# Patient Record
Sex: Male | Born: 1980 | Race: Black or African American | Hispanic: No | Marital: Single | Smoking: Current every day smoker
Health system: Southern US, Community
[De-identification: ages and names within clinical notes are randomized; demographics above are authoritative.]

## PROBLEM LIST (undated history)

## (undated) HISTORY — PX: APPENDECTOMY: SHX54

---

## 2013-05-12 DIAGNOSIS — E785 Hyperlipidemia, unspecified: Secondary | ICD-10-CM | POA: Insufficient documentation

## 2013-05-12 HISTORY — DX: Hyperlipidemia, unspecified: E78.5

## 2013-07-26 DIAGNOSIS — J3089 Other allergic rhinitis: Secondary | ICD-10-CM

## 2013-07-26 HISTORY — DX: Other allergic rhinitis: J30.89

## 2016-07-13 DIAGNOSIS — IMO0001 Reserved for inherently not codable concepts without codable children: Secondary | ICD-10-CM | POA: Insufficient documentation

## 2016-07-13 DIAGNOSIS — F331 Major depressive disorder, recurrent, moderate: Secondary | ICD-10-CM | POA: Insufficient documentation

## 2016-07-13 DIAGNOSIS — B360 Pityriasis versicolor: Secondary | ICD-10-CM

## 2016-07-13 HISTORY — DX: Major depressive disorder, recurrent, moderate: F33.1

## 2016-07-13 HISTORY — DX: Pityriasis versicolor: B36.0

## 2016-07-13 HISTORY — DX: Reserved for inherently not codable concepts without codable children: IMO0001

## 2016-08-10 DIAGNOSIS — H6993 Unspecified Eustachian tube disorder, bilateral: Secondary | ICD-10-CM

## 2016-08-10 DIAGNOSIS — H6983 Other specified disorders of Eustachian tube, bilateral: Secondary | ICD-10-CM | POA: Insufficient documentation

## 2016-08-10 HISTORY — DX: Unspecified eustachian tube disorder, bilateral: H69.93

## 2016-08-10 HISTORY — DX: Other specified disorders of eustachian tube, bilateral: H69.83

## 2017-02-01 ENCOUNTER — Ambulatory Visit: Payer: Self-pay

## 2017-02-01 ENCOUNTER — Other Ambulatory Visit: Payer: Self-pay | Admitting: Occupational Medicine

## 2017-02-01 DIAGNOSIS — Z0000 Encounter for general adult medical examination without abnormal findings: Secondary | ICD-10-CM

## 2017-12-06 DIAGNOSIS — J342 Deviated nasal septum: Secondary | ICD-10-CM | POA: Insufficient documentation

## 2017-12-06 DIAGNOSIS — J352 Hypertrophy of adenoids: Secondary | ICD-10-CM | POA: Insufficient documentation

## 2017-12-06 DIAGNOSIS — J301 Allergic rhinitis due to pollen: Secondary | ICD-10-CM | POA: Insufficient documentation

## 2017-12-06 DIAGNOSIS — J343 Hypertrophy of nasal turbinates: Secondary | ICD-10-CM | POA: Insufficient documentation

## 2017-12-06 HISTORY — DX: Hypertrophy of adenoids: J35.2

## 2017-12-06 HISTORY — DX: Deviated nasal septum: J34.2

## 2017-12-06 HISTORY — DX: Hypertrophy of nasal turbinates: J34.3

## 2017-12-06 HISTORY — DX: Allergic rhinitis due to pollen: J30.1

## 2018-01-02 DIAGNOSIS — J328 Other chronic sinusitis: Secondary | ICD-10-CM

## 2018-01-02 DIAGNOSIS — J3489 Other specified disorders of nose and nasal sinuses: Secondary | ICD-10-CM

## 2018-01-02 HISTORY — DX: Other specified disorders of nose and nasal sinuses: J34.89

## 2018-01-02 HISTORY — DX: Other chronic sinusitis: J32.8

## 2018-02-01 DIAGNOSIS — F419 Anxiety disorder, unspecified: Secondary | ICD-10-CM | POA: Insufficient documentation

## 2018-02-01 HISTORY — DX: Anxiety disorder, unspecified: F41.9

## 2018-04-19 DIAGNOSIS — R030 Elevated blood-pressure reading, without diagnosis of hypertension: Secondary | ICD-10-CM | POA: Insufficient documentation

## 2018-04-19 DIAGNOSIS — R739 Hyperglycemia, unspecified: Secondary | ICD-10-CM | POA: Insufficient documentation

## 2018-04-19 HISTORY — DX: Elevated blood-pressure reading, without diagnosis of hypertension: R03.0

## 2018-04-19 HISTORY — DX: Hyperglycemia, unspecified: R73.9

## 2018-12-06 DIAGNOSIS — T4275XA Adverse effect of unspecified antiepileptic and sedative-hypnotic drugs, initial encounter: Secondary | ICD-10-CM | POA: Insufficient documentation

## 2018-12-06 DIAGNOSIS — F411 Generalized anxiety disorder: Secondary | ICD-10-CM

## 2018-12-06 DIAGNOSIS — R42 Dizziness and giddiness: Secondary | ICD-10-CM

## 2018-12-06 HISTORY — DX: Adverse effect of unspecified antiepileptic and sedative-hypnotic drugs, initial encounter: T42.75XA

## 2018-12-06 HISTORY — DX: Generalized anxiety disorder: F41.1

## 2018-12-06 HISTORY — DX: Dizziness and giddiness: R42

## 2019-01-17 DIAGNOSIS — S90424A Blister (nonthermal), right lesser toe(s), initial encounter: Secondary | ICD-10-CM | POA: Insufficient documentation

## 2019-01-17 DIAGNOSIS — L089 Local infection of the skin and subcutaneous tissue, unspecified: Secondary | ICD-10-CM

## 2019-01-17 HISTORY — DX: Local infection of the skin and subcutaneous tissue, unspecified: L08.9

## 2019-02-13 DIAGNOSIS — D352 Benign neoplasm of pituitary gland: Secondary | ICD-10-CM

## 2019-02-13 DIAGNOSIS — R7303 Prediabetes: Secondary | ICD-10-CM

## 2019-02-13 HISTORY — DX: Benign neoplasm of pituitary gland: D35.2

## 2019-02-13 HISTORY — DX: Prediabetes: R73.03

## 2020-04-30 ENCOUNTER — Ambulatory Visit (INDEPENDENT_AMBULATORY_CARE_PROVIDER_SITE_OTHER): Payer: Self-pay | Admitting: Podiatry

## 2020-04-30 ENCOUNTER — Emergency Department (INDEPENDENT_AMBULATORY_CARE_PROVIDER_SITE_OTHER)
Admission: EM | Admit: 2020-04-30 | Discharge: 2020-04-30 | Disposition: A | Discharge status: Home / Self Care | Payer: Self-pay | Source: Home / Self Care

## 2020-04-30 ENCOUNTER — Other Ambulatory Visit: Payer: Self-pay

## 2020-04-30 ENCOUNTER — Encounter: Payer: Self-pay | Admitting: Podiatry

## 2020-04-30 ENCOUNTER — Encounter: Payer: Self-pay | Admitting: Emergency Medicine

## 2020-04-30 DIAGNOSIS — R002 Palpitations: Secondary | ICD-10-CM

## 2020-04-30 DIAGNOSIS — R509 Fever, unspecified: Secondary | ICD-10-CM

## 2020-04-30 DIAGNOSIS — H938X3 Other specified disorders of ear, bilateral: Secondary | ICD-10-CM

## 2020-04-30 DIAGNOSIS — H9203 Otalgia, bilateral: Secondary | ICD-10-CM | POA: Insufficient documentation

## 2020-04-30 DIAGNOSIS — L298 Other pruritus: Secondary | ICD-10-CM

## 2020-04-30 DIAGNOSIS — L2989 Other pruritus: Secondary | ICD-10-CM

## 2020-04-30 DIAGNOSIS — B369 Superficial mycosis, unspecified: Secondary | ICD-10-CM

## 2020-04-30 DIAGNOSIS — F1010 Alcohol abuse, uncomplicated: Secondary | ICD-10-CM

## 2020-04-30 DIAGNOSIS — R21 Rash and other nonspecific skin eruption: Secondary | ICD-10-CM

## 2020-04-30 HISTORY — DX: Otalgia, bilateral: H92.03

## 2020-04-30 HISTORY — DX: Other specified disorders of ear, bilateral: H93.8X3

## 2020-04-30 LAB — POCT URINALYSIS DIP (MANUAL ENTRY)
Bilirubin, UA: NEGATIVE
Glucose, UA: NEGATIVE mg/dL
Ketones, POC UA: NEGATIVE mg/dL
Leukocytes, UA: NEGATIVE
Nitrite, UA: NEGATIVE
Protein Ur, POC: NEGATIVE mg/dL
Spec Grav, UA: 1.015 (ref 1.010–1.025)
Urobilinogen, UA: 0.2 E.U./dL
pH, UA: 5 (ref 5.0–8.0)

## 2020-04-30 LAB — COMPLETE METABOLIC PANEL WITH GFR
AG Ratio: 1.8 (calc) (ref 1.0–2.5)
ALT: 37 U/L (ref 9–46)
AST: 25 U/L (ref 10–40)
Albumin: 4.6 g/dL (ref 3.6–5.1)
Alkaline phosphatase (APISO): 58 U/L (ref 36–130)
BUN: 18 mg/dL (ref 7–25)
CO2: 24 mmol/L (ref 20–32)
Calcium: 9.7 mg/dL (ref 8.6–10.3)
Chloride: 102 mmol/L (ref 98–110)
Creat: 1.07 mg/dL (ref 0.60–1.35)
GFR, Est African American: 101 mL/min/{1.73_m2} (ref >=60)
GFR, Est Non African American: 87 mL/min/{1.73_m2} (ref >=60)
Globulin: 2.6 g/dL (calc) (ref 1.9–3.7)
Glucose, Bld: 92 mg/dL (ref 65–99)
Potassium: 4 mmol/L (ref 3.5–5.3)
Sodium: 137 mmol/L (ref 135–146)
Total Bilirubin: 0.3 mg/dL (ref 0.2–1.2)
Total Protein: 7.2 g/dL (ref 6.1–8.1)

## 2020-04-30 LAB — POCT CBC W AUTO DIFF (K'VILLE URGENT CARE)

## 2020-04-30 LAB — POCT FASTING CBG KUC MANUAL ENTRY: POCT Glucose (KUC): 99 mg/dL (ref 70–99)

## 2020-04-30 NOTE — ED Triage Notes (Signed)
Pt woke up after a nap last night with palpitations - resolved after 2 hours  ( on & off) made pt anxious  Pt had been out in the yard yesterday - drank some alcohol w/ limited water intake Has increased stress at home & has been drinking more alcohol & started smoking again COVID vaccine & booster

## 2020-04-30 NOTE — ED Provider Notes (Signed)
[name]    CSN: [id number] Arrival date & time: 04/30/20  0920      History   Chief Complaint Chief Complaint  Patient presents with  . Palpitations    HPI Kyle Barnett is a 39 y.o. male.   HPI Kyle Barnett is a 39 y.o. male presenting to UC with c/o palpitations that started yesterday after a nap that resolved within 2 hours. Palpitations have resolved but he feels more anxious today.  He was out in the yard yesterday drinking alcohol and not as much was as usual.  He also reports increased stress at home, causing him to start smoking again and drinking more alcohol over the last 2 months, about 6-12 white claws a night.  Denies chest pain or SOB. No n/v/d.  Hx of palpitations earlier in the year. He was seen at [facility] in February 2021, was referred to a cardiologist but states he never received a call to schedule an appointment.   Pt noted to have a low-grade fever in triage, 100.2*F. pt does report mild body aches. Denies cough, congestion, sore throat, n/v/d. No known sick contacts.   Pt also reports dry itchy rash on his Right foot for over a year and a dark spot on his anterior lower leg for about 6 months. He has an appointment with podiatry later today.  History reviewed. No pertinent past medical history.  Patient Active Problem List   Diagnosis Date Noted  . Fullness in ear, bilateral 04/30/2020  . Otalgia of both ears 04/30/2020  . Prediabetes 02/13/2019  . 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 Infected blister of toe of right foot 01/17/2019  . Anticonvulsant-induced dizziness 12/06/2018  . Anxiety, generalized 12/06/2018  . Hyperglycemia 04/19/2018  . Prehypertension 04/19/2018  . Anxiety 02/01/2018  . 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 Other chronic sinusitis 01/02/2018  . Adenoid hypertrophy 12/06/2017  . Deviated nasal septum 12/06/2017  . Hypertrophy of inferior nasal turbinate 12/06/2017  . Seasonal allergic rhinitis due to pollen  12/06/2017  . Eustachian tube dysfunction, bilateral 08/10/2016  . 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 Tinea versicolor 07/13/2016  . 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 Perennial allergic rhinitis 07/26/2013  . Hyperlipidemia 05/12/2013    History reviewed. No pertinent surgical history.     Home Medications    Prior to Admission medications   Medication Sig Start Date End Date Taking? Authorizing Provider  escitalopram (LEXAPRO) 10 MG tablet Take 10 mg by mouth daily.   Yes [provider]  lamoTRIgine (LAMICTAL) 25 MG tablet Take 50 mg by mouth at bedtime. 02/08/20  Yes [provider]  amoxicillin-clavulanate (AUGMENTIN) 875-125 MG tablet amoxicillin 875 mg-potassium clavulanate 125 mg tablet    [provider]  azelastine (ASTELIN) 0.1 % nasal spray azelastine 137 mcg (0.1 %) nasal spray aerosol 08/13/18   [provider]  azithromycin (ZITHROMAX) 250 MG tablet azithromycin 250 mg tablet    [provider]  cetirizine (ZYRTEC) 10 MG tablet Take by mouth.    [provider]  chlorhexidine (PERIDEX) 0.12 % solution 15 mLs 2 (two) times daily. 12/27/18   [provider]  ergocalciferol (VITAMIN D2) 1.25 MG (50000 UT) capsule Take by mouth. 09/02/19   [provider]  fluticasone (FLONASE) 50 MCG/ACT nasal spray Place into both nostrils. 11/25/19   [provider]  Lidocaine (ZTLIDO) 1.8 % PTCH Place onto the skin. 10/20/18   [provider]  propranolol (INDERAL) 20 MG tablet propranolol 20 mg tablet    [provider]  selenium sulfide (SELSUN) 2.5 % shampoo selenium sulfide 2.5 % lotion    [provider]  sertraline (ZOLOFT) 50 MG tablet sertraline 50 mg tablet    [provider]  venlafaxine XR (EFFEXOR-XR) 150 MG 24 hr capsule venlafaxine ER 150 mg capsule,extended release 24 hr    [provider]     Family History Family History  Problem Relation Age of Onset  . Healthy Mother   . Diabetes Father   . Prostate cancer Father   . Fibromyalgia Sister     Social History Social History   Tobacco Use  . Smoking status: Current Every Day Smoker    Packs/day: 0.75    Types: Cigarettes  . Smokeless tobacco: Never Used  Vaping Use  . Vaping Use: Never used  Substance Use Topics  . Alcohol use: Yes    Alcohol/week: 70.0 standard drinks    Types: 70 Standard drinks or equivalent per week    Comment: white claws (6-12 night) x 2.5 months  . Drug use: Never     Allergies   Clindamycin/lincomycin, Lorazepam, and Fluticasone   Review of Systems Review of Systems  Constitutional: Positive for fever (low-grade in triage). Negative for chills.  HENT: Negative for congestion, ear pain, sore throat, trouble swallowing and voice change.   Respiratory: Negative for cough and shortness of breath.   Cardiovascular: Positive for palpitations. Negative for chest pain.  Gastrointestinal: Negative for abdominal pain, diarrhea, nausea and vomiting.  Musculoskeletal: Positive for arthralgias and myalgias. Negative for back pain.  Skin: Negative for rash.  Neurological: Negative for dizziness, syncope, light-headedness and headaches.  Psychiatric/Behavioral: The patient is nervous/anxious.   All other systems reviewed and are negative.    Physical Exam Triage Vital Signs ED Triage Vitals  Enc Vitals Group     BP 04/30/20 0934 (!) 144/92     Pulse Rate 04/30/20 0934 96     Resp 04/30/20 0934 17     Temp 04/30/20 0934 100.2 F (37.9 C)     Temp Source 04/30/20 0934 Oral     SpO2 04/30/20 0934 99 %     Weight 04/30/20 0940 220 lb (99.8 kg)     Height 04/30/20 0940 5\' 9"  (1.753 m)     Head Circumference --      Peak Flow --      Pain Score 04/30/20 0940 0     Pain Loc --      Pain Edu? --      Excl. in [place]? --    No data found.  Updated Vital Signs BP (!) 144/92 (BP  Location: Right Arm)   Pulse 96   Temp 100.2 F (37.9 C) (Oral)   Resp 17   Ht 5\' 9"  (1.753 m)   Wt 220 lb (99.8 kg)   SpO2 99%   BMI 32.49 kg/m   Visual Acuity Right Eye Distance:   Left Eye Distance:   Bilateral Distance:    Right Eye Near:   Left Eye Near:    Bilateral Near:     Physical Exam Vitals and nursing note reviewed.  Constitutional:      General: He is not in acute distress.    Appearance: Normal appearance. He is well-developed. He is obese. He is not ill-appearing, toxic-appearing or diaphoretic.  HENT:     Head: Normocephalic and atraumatic.     Right Ear: Tympanic membrane  and ear canal normal.     Left Ear: Tympanic membrane and ear canal normal.     Nose: Nose normal.     Mouth/Throat:     Mouth: Mucous membranes are moist.     Pharynx: Oropharynx is clear.  Cardiovascular:     Rate and Rhythm: Normal rate and regular rhythm.  Pulmonary:     Effort: Pulmonary effort is normal. No respiratory distress.     Breath sounds: Normal breath sounds. No stridor. No wheezing, rhonchi or rales.  Abdominal:     General: There is no distension.     Palpations: Abdomen is soft.     Tenderness: There is no abdominal tenderness.  Musculoskeletal:        General: Normal range of motion.     Cervical back: Normal range of motion and neck supple.  Skin:    General: Skin is warm and dry.     Findings: Rash present.       Neurological:     Mental Status: He is alert and oriented to person, place, and time.  Psychiatric:        Behavior: Behavior normal.      UC Treatments / Results  Labs (all labs ordered are listed, but only abnormal results are displayed) Labs Reviewed  POCT URINALYSIS DIP (MANUAL ENTRY) - Abnormal; Notable for the following components:      Result Value   Color, UA yellow (*)    Clarity, UA clear (*)    Blood, UA trace-lysed (*)    All other components within normal limits  COVID-19, FLU A+B AND RSV   Narrative:    Test(s)  140142-Influenza A, NAA; 140143-Influenza B, NAA; 140144- RSV, NAA was developed and its performance characteristics determined by [facility]. It has not been cleared or approved by the Food and Drug Administration. Performed at:  [address], [place], [place]  [id number] Lab Director: Rush Farmer MD, Phone:  [phone]  COMPLETE METABOLIC PANEL WITH GFR  POCT FASTING CBG KUC MANUAL ENTRY  POCT CBC W AUTO DIFF ([facility])    EKG Date/Time: 04/30/2020   10:02:21 Ventricular Rate: 94 PR Interval: 130 QRS Duration: 88 QT Interval: 350 QTC Calculation: 437 P-R-T axes: 74   77   31 Text Interpretation: Normal sinus rhythm.  Normal ECG   Radiology No results found.  Procedures Procedures (including critical care time)  Medications Ordered in UC Medications - No data to display  Initial Impression / Assessment and Plan / UC Course  I have reviewed the triage vital signs and the nursing notes.  Pertinent labs & imaging results that were available during my care of the patient were reviewed by me and considered in my medical decision making (see chart for details).     Reassured pt of normal EKG Encouraged to establish care with PCP but also to use resource guide to establish care with [facility] to help with alcohol abuse as pt knows he cannot stop alcohol cold [place] and will need help. Discussed symptoms that warrant emergent care in the ED.  Final Clinical Impressions(s) / UC Diagnoses   Final diagnoses:  Alcohol abuse  Palpitations  Fever in adult  Chronic pruritic rash in adult     Discharge Instructions       Please use resource guide provided to help establish care with primary care or a substance abuse specialist to help with alcohol withdrawal.  Even when drinking alcohol, be sure to stay well  hydrated, try to get at least 8 hours of sleep at night, limit or cut out caffeine, all of these can help limit palpitations and  anxiety.    Call 911 or have someone drive you to the hospital if symptoms significantly worsening.  Emergency Department Resource Guide 1) Find a Doctor and Pay Out of Pocket Although you won't have to find out who is covered by your insurance plan, it is a good idea to ask around and get recommendations. You will then need to call the office and see if the doctor you have chosen will accept you as a new patient and what types of options they offer for patients who are self-pay. Some doctors offer discounts or will set up payment plans for their patients who do not have insurance, but you will need to ask so you aren't surprised when you get to your appointment.  2) Contact Your Local Health Department Not all health departments have doctors that can see patients for sick visits, but many do, so it is worth a call to see if yours does. If you don't know where your local health department is, you can check in your phone book. The CDC also has a tool to help you locate your state's health department, and many state websites also have listings of all of their local health departments.  3) Find a [facility] If your illness is not likely to be very severe or complicated, you may want to try a walk in clinic. These are popping up all over the country in pharmacies, drugstores, and shopping centers. They're usually staffed by nurse practitioners or physician assistants that have been trained to treat common illnesses and complaints. They're usually fairly quick and inexpensive. However, if you have serious medical issues or chronic medical problems, these are probably not your best option.  No Primary Care Doctor: - Call Health Connect at  [phone] - they can help you locate a primary care doctor that  accepts your insurance, provides certain services, etc. - Physician Referral Service- [phone]  Chronic Pain Problems: Organization         Address  Phone   Notes  [facility]  [phone] Patients need to be referred by their primary care doctor.   Medication Assistance: Organization         Address  Phone   Notes  [facility] Medication [facility] [place] [place], [place] [address] [phone] --Must be a resident of [facility] -- Must have NO insurance coverage whatsoever (no Medicaid/ Medicare, etc.) -- The pt. MUST have a primary care doctor that directs their care regularly and follows them in the community   Med[facility]ist  [phone]   [employer]  [phone]    Agencies that provide inexpensive medical care: Microbiologist  Notes  [facility]  [phone]   [facility]    [phone]   [facility] [place], [place] [address] [phone]   [place] [address], [place] [phone]   Planned Parenthood    [phone]   [facility]    [phone]   [place] and [address], [place] Phone:  [phone], Fax:  [phone] Hours of Operation:  9 am - 6 pm, M-F.  Also accepts Medicaid/Medicare and self-pay.  [facility] for [place] [address], [address], [place] Phone: [phone], Fax: [phone]. Hours of Operation:  8:30 am - 5:30 pm, M-F.  Also accepts Medicaid and self-pay.  [facility] [place] [address], [place] Phone: [phone]   [place], [place], [place] [phone], Ext. [phone] Mondays & Thursdays: 7-9 AM.  First 15 patients are seen on a first come, first serve basis.    [facility] Providers:  Organization         Address                                                                        Phone                               Notes  [facility] [address], [place] [phone] Also accepts self-pay patients.  [facility] [address], [place]  [phone]   [place], [address], [place] [phone]   [facility] [address], [place] [phone]   Lucianne Lei [address], [place]   [phone] Only accepts [place] Access [place] patients after they have their name applied to their card.   Self-Pay (no insurance) in [facility]:   Organization         Address                                                     Phone               Notes  Sickle Cell Patients, [facility] Internal Medicine [place] [phone]   [facility] [place] [phone]   [facility] [place]  [place], [place],  [phone]   [facility]/Dr. Osei-Bonsu  [address], [place] or [address], [place] [phone] Phone number for both [place] and [place] locations is the same.  Urgent Medical and [facility] [place] [address] [place] [phone]   [facility] [address], B and E or [address] [phone]   [place] [facility] [place] [phone], phone; [phone], fax Sees patients 1st and 3rd Saturday of every month.  Must not qualify for public or private insurance (i.e. Medicaid, Medicare, [place] Health Choice, Veterans' Benefits) . Household income should be no more than 200% of the poverty level .The clinic cannot treat you if you are pregnant or think you are pregnant . Sexually transmitted diseases are not treated at the clinic.    Dental Care: Organization         Address                                   Phone                       Notes  [facility] Department of [facility] [place] [phone] Accepts children up to age 22 who are enrolled in [place] or [place]; pregnant women with a Medicaid card; and children who have applied for Medicaid or [place] Health Choice, but were declined, whose parents can pay a reduced fee at time of service.  [facility] Department of [facility]  [address], [place] [phone] Accepts children up to age 74 who are enrolled in [place] or [place]; pregnant women with a Medicaid card; and children who have applied for Medicaid or [place] Health Choice, but were declined, whose parents can pay a reduced fee at time of service.  [facility] Access PROGRAM  [place] [phone] Patients are seen by appointment only. Walk-ins are not accepted. [place] will see patients 76 years of age and older. Monday - Tuesday (8am-5pm) Most Wednesdays (8:30-5pm) $30 per visit, cash only  [facility] Adult Dental Access PROGRAM  [address], [facility] [phone] Patients are seen by appointment only. Walk-ins are not accepted. [place] will see patients 64 years of age and older. One Wednesday Evening (Monthly: Volunteer Based).  $30 per visit, cash only  [place]  [phone] for adults; Children under age 28, call Graduate Pediatric Dentistry at [phone]. Children aged 34-14, please call [phone] to request a pediatric application.  Dental services are provided in all areas of dental care including fillings, crowns and bridges, complete and partial dentures, implants, gum treatment, root canals, and extractions. Preventive care is also provided. Treatment is provided to both adults and children. Patients are selected via a lottery and there is often a waiting list.   [facility] [address], [place]  [phone] [email or web address]   [facility] [address] [place], [place] [phone], Ext. [phone] Second and Fourth Thursday of each month, opens at 6:30 AM; Clinic ends at 9 AM.  Patients are seen on a first-come first-served basis, and a limited number are seen during each clinic.   [facility]  [address] [place], [place] [phone]   Eligibility Requirements You must have lived in [place], [place], or [place] counties for at least the last three months.   You cannot be eligible for state or federal sponsored [employer], including [employer], [place], or [employer].   You generally cannot be eligible for healthcare insurance through your employer.    How to apply: Eligibility screenings are held every  Tuesday and Wednesday afternoon from 1:00 pm until 4:00 pm. You do not need an appointment for the interview!  [facility] [address] [place], [place]   [place]  [place] Department  [place]  [phone]    Behavioral Health Resources in the Community: Intensive Outpatient Programs Organization         Address                                              Phone              Notes  [place]. [address] [place], [place] [phone]   [facility] Outpatient [address], [place], [place]   ADS: Alcohol & Drug Svcs [address], [place], [place]   [place] [address],  [place], [place] or [phone]   Substance Abuse Resources Organization         Address                                Phone  Notes  Alcohol and Drug Services  [phone]   [place]  [phone]   [facility] [place]   Chinita Pester   [phone]   Residential & Outpatient Substance Abuse Program  [phone]   Psychological Services Organization         Address                                  Phone                Notes  [facility]  [place]  [phone]   [place] [address], [place] or [phone]    Mobile Crisis Teams Organization         Address  Phone  Notes  [facility], Mobile Crisis Care Unit  [phone]   [facility] Psychotherapeutic Services  [address] [place], [place]   [place] [address], [place] [phone]    Self-Help/Support Groups Organization         Address                         Phone             Notes  [place]. of [place] - variety of support groups  [place] Call for more information  Narcotics Anonymous (NA), [facility] [address], [employer] [place]  2 meetings at this location   Special educational needs teacher         Address                                                    Phone              Notes  [facility]idential Treatment [phone]  [address],    [place]  [phone]   [facility]  [address], [place] [address], [place], [place]   [place], [place] [phone] Admissions: 8am-3pm M-F  Incentives Substance [place] [address]    [place], [place] [phone]   [facility] [address] [place], [place], [place]   [facility] [address]  [place], [place]   [facility] - Intensive Outpatient [address]een [address], [place], [place]   [facility] ([place].,  [place], [place] [phone] or [phone]   [facility]idential Treatment Services (RTS) [address] [place], [place] Accepts Medicaid   [facility] [place] [address],  [place] [phone] Substance Abuse/Addiction Treatment   [facility] Organization         Address                                                            Phone                    Notes  [facility]  [phone]   Domenic Schwab, PhD [address] [place]lis [place], [place]   [phone] or [phone]   [place], [place] [phone]   [facility] [address] [place], [place] [phone] Insurance/Medicaid/sponsorship through [facility] and Families [address]                                    [place], [place] [phone] [place] [address], [place] [phone]    Dr. Adele Schilder  [phone]   [facility] Dept. 1) [address], [place] 2) [place] 3)  [place] [address], [place] [phone]  [phone]   [place] [phone] or [phone] (After Hours)           ED Prescriptions    None     PDMP not reviewed this encounter.   Noe Gens, [place] 05/03/20 [phone]

## 2020-04-30 NOTE — Discharge Instructions (Addendum)
Please use resource guide provided to help establish care with primary care or a substance abuse specialist to help with alcohol withdrawal.  Even when drinking alcohol, be sure to stay well hydrated, try to get at least 8 hours of sleep at night, limit or cut out caffeine, all of these can help limit palpitations and anxiety.    Call 911 or have someone drive you to the hospital if symptoms significantly worsening.  Emergency Department Resource Guide 1) Find a Doctor and Pay Out of Pocket Although you won't have to find out who is covered by your insurance plan, it is a good idea to ask around and get recommendations. You will then need to call the office and see if the doctor you have chosen will accept you as a new patient and what types of options they offer for patients who are self-pay. Some doctors offer discounts or will set up payment plans for their patients who do not have insurance, but you will need to ask so you aren't surprised when you get to your appointment.  2) Contact Your Local Health Department Not all health departments have doctors that can see patients for sick visits, but many do, so it is worth a call to see if yours does. If you don't know where your local health department is, you can check in your phone book. The CDC also has a tool to help you locate your state's health department, and many state websites also have listings of all of their local health departments.  3) Find a [facility] If your illness is not likely to be very severe or complicated, you may want to try a walk in clinic. These are popping up all over the country in pharmacies, drugstores, and shopping centers. They're usually staffed by nurse practitioners or physician assistants that have been trained to treat common illnesses and complaints. They're usually fairly quick and inexpensive. However, if you have serious medical issues or chronic medical problems, these are probably not your best  option.  No Primary Care Doctor: Call Health Connect at  [phone] - they can help you locate a primary care doctor that  accepts your insurance, provides certain services, etc. Physician Referral Service- [phone]  Chronic Pain Problems: Organization         Address  Phone   Notes  [facility]  [phone] Patients need to be referred by their primary care doctor.   Medication Assistance: Organization         Address  Phone   Notes  [facility] Medication [facility] [place] [place], [place] [address] [phone] --Must be a resident of [facility] -- Must have NO insurance coverage whatsoever (no Medicaid/ Medicare, etc.) -- The pt. MUST have a primary care doctor that directs their care regularly and follows them in the community   MedAssist  [phone]   [employer]  [phone]    Agencies that provide inexpensive medical care: Microbiologist  Notes  Marty  [phone]   [facility]    [phone]   [facility] [place], [place] [address] [phone]   [place] [address] [place] [phone]   Planned Parenthood    7195938594   [facility]    [phone]   [place] and [place] [address], [place] Phone:  [phone], Fax:  [phone] Hours of Operation:  9 am - 6 pm, M-F.  Also accepts Medicaid/Medicare and self-pay.  [facility] for [address], [address], [place] Phone: [phone], Fax: [phone]. Hours of Operation:  8:30 am - 5:30 pm, M-F.  Also accepts Medicaid and self-pay.  [facility] [place] [address] [place] Phone: [phone]    [place], [place], [place] [phone], Ext. [phone] Mondays & Thursdays: 7-9 AM.  First 15 patients are seen on a first come, first serve basis.    [place] Providers:  Organization         Address                                                                       Phone                               Notes  [facility] [address], [place] [phone] Also accepts self-pay patients.  [facility] [address], [place]  [phone]   [place], [address], [place] [phone]   [facility] [address] [place] [phone]   Lucianne Lei [address], [place]   [phone] Only accepts [place] Access [place] patients after they have their name applied to their card.   Self-Pay (no insurance) in [facility]:   Organization         Address                                                     Phone               Notes  Sickle Cell Patients, [facility] [place] [phone]   [facility] [place] [phone]   [facility] [place]  [place], [place], [place] [phone]   [facility]/Dr. Osei-Bonsu  [address] [place] or [address], [place] [phone] Phone number for both [place] and [place] locations is the same.  [facility] [address], [place] [phone]   [facility] [address], [place] or [address] [phone]   [facility] [place] [phone], phone; [phone], fax Sees patients 1st and 3rd Saturday of every month.  Must not qualify for public or private insurance (i.e.  Medicaid, Medicare, [place] Health Choice, Veterans' Benefits)  Household income should be no more than 200% of the poverty level The clinic cannot treat you if you are pregnant or think you are pregnant  Sexually transmitted diseases are not treated at the clinic.    Dental Care: Organization         Address                                  Phone                       Notes  [facility] [place] [phone] Accepts children up to age 82 who are enrolled in [place] or [place]; pregnant women with a Medicaid card; and children who have applied for Medicaid or [place] Health Choice, but were declined, whose parents can pay a reduced fee at time of service.  [facility] of [facility]  [address], [place] [phone] Accepts children up to age 63 who are enrolled in [place] or [place]; pregnant women with a Medicaid card; and children who have applied for Medicaid or [place] Health Choice, but were declined, whose parents can pay a reduced fee at time of service.  Platinum Adult Dental Access PROGRAM  [place] [phone] Patients are seen by appointment only. Walk-ins are not accepted. [place] will see patients 68 years of age and older. Monday - Tuesday (8am-5pm) Most Wednesdays (8:30-5pm) $30 per visit, cash only  [facility] Adult Dental Access PROGRAM  [address], [facility] [phone] Patients are seen by appointment only. Walk-ins are not accepted. [place] will see patients 70 years of age and older. One Wednesday Evening (Monthly: Volunteer Based).  $30 per visit, cash only  [place]  [phone] for adults; Children under age 65, call Graduate Pediatric Dentistry at [phone]. Children aged 24-14, please call [phone] to request a pediatric application.  Dental services are  provided in all areas of dental care including fillings, crowns and bridges, complete and partial dentures, implants, gum treatment, root canals, and extractions. Preventive care is also provided. Treatment is provided to both adults and children. Patients are selected via a lottery and there is often a waiting list.   [facility] [address], [place]  [phone] [email or web address]   [facility] [address] [place], [place] [phone], Ext. [phone] Second and Fourth Thursday of each month, opens at 6:30 AM; Clinic ends at 9 AM.  Patients are seen on a first-come first-served basis, and a limited number are seen during each clinic.   [facility]  [address] Hillard Danker [place], [place] [phone]   Eligibility Requirements You must have lived in [place], [place], or [place] counties for at least the last three months.   You cannot be eligible for state or federal sponsored [employer], including [employer], [place], or [employer].   You generally cannot be eligible for healthcare insurance through your employer.    How to apply: Eligibility screenings are held every  Tuesday and Wednesday afternoon from 1:00 pm until 4:00 pm. You do not need an appointment for the interview!  [facility] [address], [place], [place]   [place]  [place] Department  [place]  [phone]    Behavioral Health Resources in the Community: Intensive Outpatient Programs Organization         Address                                              Phone              Notes  [place]. [address] [place], [place] [phone]   [facility] Outpatient [address], [place], [place]   ADS: Alcohol & Drug Svcs [address], [place], [place]   [place] [address],  [place], [place] or [phone]   Substance Abuse Resources Organization         Address                                Phone  Notes  Alcohol and Drug Services  [phone]   [place]  [phone]   [facility] [place]   Chinita Pester  [phone]   Residential & Outpatient Substance Abuse Program  [phone]   Psychological Services Organization         Address                                  Phone                Notes  [facility] [place]  [place]  [phone]   [facility] [place] [address], [place] or [phone]    Mobile Crisis Teams Organization         Address  Phone  Notes  [facility], Mobile Crisis Care Unit  [phone]   [facility]  [address] [place], [place]   [place] [address], [place] [phone]    Self-Help/Support Groups Organization         Address                         Phone             Notes  [place]. of [place] - variety of support groups  [place] Call for more information  Narcotics Anonymous (NA), [facility] [address], [employer] [place]  2 meetings at this location   Special educational needs teacher         Address                                                    Phone              Notes  ASAP Residential Treatment [phone]  [address],    [place]  [phone]   [facility]  [address] [place] [address], [place], [place]   [place], [place] [phone] Admissions: 8am-3pm M-F  Incentives Substance [place] [address]    [place], [place] [phone]   [facility] [address] [place], [place], [place]   [facility] [address],  [place], [place]   [facility] Programs - Intensive Outpatient [address], [place], [place]   [facility] ([place].,  [place], [place] [phone] or [phone]   [facility] Services (RTS) [address], [place], [place] Accepts Medicaid  [facility] [place] [address],  [place] [phone] Substance Abuse/Addiction Treatment   [facility] Organization         Address                                                            Phone                    Notes  [facility]  [phone]   Domenic Schwab, PhD [address] Arlis [place], [place]   [phone] or [phone]   [place], [place] [phone]   [facility] [address] [place], [place] [phone] Insurance/Medicaid/sponsorship through [facility] and Families [address]                                    [place], [place] [phone] [place] [address] [employer], [place] [phone]    Dr. Adele Schilder  [phone]   [facility] Dept. 1) [address] [place] 2) [place] 3)  [address], [place] [phone]  [phone]   [place] [phone] or [phone] (After Hours)

## 2020-04-30 NOTE — ED Notes (Addendum)
Stat pick up called for cmp - confirmation #[id number] at 1113

## 2020-05-02 LAB — COVID-19, FLU A+B AND RSV
Influenza A, NAA: NOT DETECTED
Influenza B, NAA: NOT DETECTED
RSV, NAA: NOT DETECTED
SARS-CoV-2, NAA: NOT DETECTED

## 2020-05-03 ENCOUNTER — Other Ambulatory Visit: Payer: Self-pay

## 2020-05-04 ENCOUNTER — Ambulatory Visit: Payer: Self-pay | Admitting: Cardiology

## 2020-05-05 NOTE — Progress Notes (Signed)
Subjective:   Patient ID: Kyle Barnett, male   DOB: 39 y.o.   MRN: [id number]   HPI 39 year old male presents the office today for concerns of a skin rash on the tops of both of his big toes which is ongoing for quite some time.  He has seen podiatry as well as his primary care physician.  He has tried multiple steroid creams, antifungal medications were insignificant improvement.  He says at one time he actually had some clear drainage coming from the area.  It does itch occasionally.  He has no other concerns today.   Review of Systems  All other systems reviewed and are negative.  Past Medical History:  Diagnosis Date  . Adenoid hypertrophy 12/06/2017  . Anticonvulsant-induced dizziness 12/06/2018  . Anxiety 02/01/2018  . Anxiety, generalized 12/06/2018  . 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 Deviated nasal septum 12/06/2017  . Eustachian tube dysfunction, bilateral 08/10/2016  . Fullness in ear, bilateral 04/30/2020  . Hyperglycemia 04/19/2018  . Hyperlipidemia 05/12/2013  . Hypertrophy of inferior nasal turbinate 12/06/2017  . Infected blister of toe of right foot 01/17/2019  . Moderate episode of recurrent major depressive disorder ([place]) 07/13/2016  . Otalgia of both ears 04/30/2020  . Other chronic sinusitis 01/02/2018  . Perennial allergic rhinitis 07/26/2013  . Pituitary microadenoma ([place]) 02/13/2019  . Prediabetes 02/13/2019  . Prehypertension 04/19/2018  . Seasonal allergic rhinitis due to pollen 12/06/2017  . Tinea versicolor 07/13/2016  . 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 No pertinent surgical history.   Current Outpatient Medications:  .  azelastine (ASTELIN) 0.1 % nasal spray, azelastine 137 mcg (0.1 %) nasal spray aerosol, Disp: , Rfl:  .  chlorhexidine (PERIDEX) 0.12 % solution, 15 mLs 2 (two) times daily., Disp: , Rfl:  .  ergocalciferol (VITAMIN D2) 1.25 MG (50000 UT) capsule, Take by mouth., Disp: , Rfl:  .  Lidocaine (ZTLIDO) 1.8 % PTCH, Place  onto the skin., Disp: , Rfl:  .  amoxicillin-clavulanate (AUGMENTIN) 875-125 MG tablet, amoxicillin 875 mg-potassium clavulanate 125 mg tablet, Disp: , Rfl:  .  azithromycin (ZITHROMAX) 250 MG tablet, azithromycin 250 mg tablet, Disp: , Rfl:  .  cetirizine (ZYRTEC) 10 MG tablet, Take by mouth., Disp: , Rfl:  .  escitalopram (LEXAPRO) 10 MG tablet, Take 10 mg by mouth daily., Disp: , Rfl:  .  FLUoxetine (PROZAC) 20 MG capsule, Take 20 mg by mouth., Disp: , Rfl:  .  fluticasone (FLONASE) 50 MCG/ACT nasal spray, Place into both nostrils., Disp: , Rfl:  .  lamoTRIgine (LAMICTAL) 25 MG tablet, Take 50 mg by mouth at bedtime., Disp: , Rfl:  .  propranolol (INDERAL) 20 MG tablet, propranolol 20 mg tablet, Disp: , Rfl:  .  selenium sulfide (SELSUN) 2.5 % shampoo, selenium sulfide 2.5 % lotion, Disp: , Rfl:  .  sertraline (ZOLOFT) 50 MG tablet, sertraline 50 mg tablet, Disp: , Rfl:  .  venlafaxine XR (EFFEXOR-XR) 150 MG 24 hr capsule, venlafaxine ER 150 mg capsule,extended release 24 hr, Disp: , Rfl:   Allergies  Allergen Reactions  . Clindamycin/Lincomycin Hives, Other (See Comments) and Rash    Other reaction(s): Angioedema Heart racing   . Lorazepam Swelling    Other reaction(s): Other (See Comments) Feels like throat is closing Swelling of throat   . Fluticasone     Other reaction(s): Other Feels like he is getting the flu        Objective:  Physical Exam  General:  AAO x3, NAD  Dermatological: On the dorsal hallux bilaterally is dry, scaly, peeling skin.  There is no excoriation, erythema.  No skin breakdown or pustules identified today.  No signs of infection.  Vascular: Dorsalis Pedis artery and Posterior Tibial artery pedal pulses are 2/4 bilateral with immedate capillary fill time.  There is no pain with calf compression, swelling, warmth, erythema.   Neruologic: Grossly intact via light touch bilateral.    Musculoskeletal: No gross boney pedal deformities bilateral. No pain,  crepitus, or limitation noted with foot and ankle range of motion bilateral. Muscular strength 5/5 in all groups tested bilateral.  Gait: Unassisted, Nonantalgic.       Assessment:   Bilateral hallux skin rash    Plan:  -Treatment options discussed including all alternatives, risks, and complications -Etiology of symptoms were discussed -Today I did scrape the skin and I sent this for sample, biopsy to [facility] labs.  Will await the results of the culture, pathology before proceeding with more definitive treatment.  Trula Slade DPM

## 2020-05-19 ENCOUNTER — Telehealth: Payer: Self-pay | Admitting: Cardiology

## 2020-05-19 ENCOUNTER — Ambulatory Visit: Payer: Self-pay | Admitting: Cardiology

## 2020-05-19 NOTE — Telephone Encounter (Signed)
Pt c/o BP issue: STAT if pt c/o blurred vision, one-sided weakness or slurred speech  1. What are your last 5 BP readings? 166/100  2. Are you having any other symptoms (ex. Dizziness, headache, blurred vision, passed out)? Kyle Barnett and feels "weird"  3. What is your BP issue? High BP   Patient missed his appt with Dr. Harriet Masson today at 8:40am

## 2020-05-19 NOTE — Telephone Encounter (Signed)
Pt states that he was seen in the ED for his sx.

## 2020-05-20 ENCOUNTER — Other Ambulatory Visit: Payer: Self-pay | Admitting: Podiatry

## 2020-05-20 ENCOUNTER — Telehealth: Payer: Self-pay | Admitting: *Deleted

## 2020-05-20 MED ORDER — AMMONIUM LACTATE 12 % EX CREA: TOPICAL_CREAM | CUTANEOUS | 0 refills | Status: DC | PRN

## 2020-05-20 NOTE — Telephone Encounter (Signed)
Called and spoke with the patient and relayed the message per Dr Jacqualyn Posey that the results were negative for fungus and bacteria and sent over a good moisturizer and to call the office if any concerns or questions. Lattie Haw

## 2020-05-26 ENCOUNTER — Ambulatory Visit (INDEPENDENT_AMBULATORY_CARE_PROVIDER_SITE_OTHER): Payer: Self-pay

## 2020-05-26 ENCOUNTER — Ambulatory Visit (INDEPENDENT_AMBULATORY_CARE_PROVIDER_SITE_OTHER): Payer: Self-pay | Admitting: Cardiology

## 2020-05-26 ENCOUNTER — Encounter: Payer: Self-pay | Admitting: Cardiology

## 2020-05-26 ENCOUNTER — Other Ambulatory Visit: Payer: Self-pay

## 2020-05-26 VITALS — BP 142/86 | HR 87 | Ht 69.0 in | Wt 232.0 lb

## 2020-05-26 DIAGNOSIS — R002 Palpitations: Secondary | ICD-10-CM

## 2020-05-26 DIAGNOSIS — R0609 Other forms of dyspnea: Secondary | ICD-10-CM | POA: Insufficient documentation

## 2020-05-26 DIAGNOSIS — R0600 Dyspnea, unspecified: Secondary | ICD-10-CM

## 2020-05-26 DIAGNOSIS — R079 Chest pain, unspecified: Secondary | ICD-10-CM

## 2020-05-26 DIAGNOSIS — E785 Hyperlipidemia, unspecified: Secondary | ICD-10-CM

## 2020-05-26 DIAGNOSIS — R0789 Other chest pain: Secondary | ICD-10-CM

## 2020-05-26 HISTORY — DX: Other forms of dyspnea: R06.09

## 2020-05-26 HISTORY — DX: Palpitations: R00.2

## 2020-05-26 HISTORY — DX: Dyspnea, unspecified: R06.00

## 2020-05-26 HISTORY — DX: Other chest pain: R07.89

## 2020-05-26 NOTE — Progress Notes (Signed)
ekg 

## 2020-05-26 NOTE — Patient Instructions (Signed)
Medication Instructions:  Your physician recommends that you continue on your current medications as directed. Please refer to the Current Medication list given to you today.  *If you need a refill on your cardiac medications before your next appointment, please call your pharmacy*   Lab Work: Your physician recommends that you return for lab work in: TODAY PSA, Lipids If you have labs (blood work) drawn today and your tests are completely normal, you will receive your results only by: Marland Kitchen MyChart Message (if you have MyChart) OR . A paper copy in the mail If you have any lab test that is abnormal or we need to change your treatment, we will call you to review the results.   Testing/Procedures: Your physician has requested that you have an echocardiogram. Echocardiography is a painless test that uses sound waves to create images of your heart. It provides your doctor with information about the size and shape of your heart and how well your heart's chambers and valves are working. This procedure takes approximately one hour. There are no restrictions for this procedure.  A zio monitor was ordered today. It will remain on for 7 days. You will then return monitor and event diary in provided box. It takes 1-2 weeks for report to be downloaded and returned to [place]. We will call you with the results. If monitor falls off or has orange flashing light, please call Zio for further instructions.      Follow-Up: At [facility], you and your health needs are our priority.  As part of our continuing mission to provide you with exceptional heart care, we have created designated Provider Care Teams.  These Care Teams include your primary Cardiologist (physician) and Advanced Practice Providers (APPs -  Physician Assistants and Nurse Practitioners) who all work together to provide you with the care you need, when you need it.  We recommend signing up for the patient portal called "MyChart".  Sign up  information is provided on this After Visit Summary.  MyChart is used to connect with patients for Virtual Visits (Telemedicine).  Patients are able to view lab/test results, encounter notes, upcoming appointments, etc.  Non-urgent messages can be sent to your provider as well.   To learn more about what you can do with MyChart, go to [email or web address].    Your next appointment:   2 month(s)  The format for your next appointment:   In Person  Provider:   Gypsy Balsam, MD   Other Instructions

## 2020-05-26 NOTE — Progress Notes (Signed)
Cardiology Consultation:    Date:  05/26/2020   ID:  Kyle Barnett, DOB 1981-02-25, MRN [id number]  PCP:  Rebecka Apley, NP  Cardiologist:  Gypsy Balsam, MD   Referring MD: Lurene Shadow, PA-C   Chief Complaint  Patient presents with   F/u palpitations and chest pain    History of Present Illness:    Kyle Barnett is a 39 y.o. male who is being seen today for the evaluation of palpitations shortness of breath chest pain at the request of Lurene Shadow, PA-C.  He does have a very stressful life.  Recently he is pleased with his fiance, also started new job he works during the night.  Also started drinking alcohol back he drinks about 6-12 drinks a day.  Also started smoking.  The reason for our evaluation is the fact that he ended up having some palpitations he described fluttering in the chest when he feel his heart going fast and regular.  When it does happen he get very anxious.  There is some shortness of breath.  Also described to have some uneasy sensation in the chest that happen in different situation typically during the stressful situations.  He does not exercise on the regular basis but he started walking on the regular basis.  Sadly he started smoking back almost 1 pack/day.  Previously he smokes less than 1 pack and he was able to quit cold [place].  He used to drink alcohol however stopped but now he is back to drinking he understand it is a problem Does not have family history of premature coronary artery disease He did have hyperlipidemia but does not know the numbers. He does not stick with any diet. 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   Deviated nasal septum 12/06/2017   Eustachian tube  dysfunction, bilateral 08/10/2016   Fullness in ear, bilateral 04/30/2020   Hyperglycemia 04/19/2018   Hyperlipidemia 05/12/2013   Hypertrophy of inferior nasal turbinate 12/06/2017   Infected blister of toe of right foot 01/17/2019   Moderate episode of recurrent major depressive disorder (HCC) 07/13/2016   Otalgia of both ears 04/30/2020   Other chronic sinusitis 01/02/2018   Perennial allergic rhinitis 07/26/2013   Pituitary microadenoma (HCC) 02/13/2019   Prediabetes 02/13/2019   Prehypertension 04/19/2018   Seasonal allergic rhinitis due to pollen 12/06/2017   Tinea versicolor 07/13/2016   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 No pertinent surgical history.  Current Medications: Current Meds  Medication Sig   ammonium lactate (AMLACTIN) 12 % cream Apply topically as needed for dry skin.   chlorhexidine (PERIDEX) 0.12 % solution 15 mLs 2 (two) times daily.   escitalopram (LEXAPRO) 10 MG tablet Take 10 mg by mouth daily.   lamoTRIgine (LAMICTAL) 25 MG tablet Take 50 mg by mouth at bedtime.   [DISCONTINUED] ergocalciferol (VITAMIN D2) 1.25 MG (50000 UT) capsule Take by mouth.   [DISCONTINUED] FLUoxetine (PROZAC) 20 MG capsule Take 20 mg by mouth.     Allergies:   Clindamycin/lincomycin, Lorazepam, and Fluticasone   Social History   Socioeconomic History   Marital status: Single    Spouse name: Not on file   Number of children: Not on  file   Years of education: Not on file   Highest education level: Not on file  Occupational History   Not on file  Tobacco Use   Smoking status: Current Every Day Smoker    Packs/day: 0.75    Types: Cigarettes   Smokeless tobacco: Never Used  Vaping Use   Vaping Use: Never used  Substance and Sexual Activity   Alcohol use: Yes    Alcohol/week: 70.0 standard drinks    Types: 70 Standard drinks or equivalent per week    Comment: white claws (6-12 night) x 2.5 months   Drug use: Never    Sexual activity: Yes    Birth control/protection: None  Other Topics Concern   Not on file  Social History Narrative   Not on file   Social Determinants of Health   Financial Resource Strain: Not on file  Food Insecurity: Not on file  Transportation Needs: Not on file  Physical Activity: Not on file  Stress: Not on file  Social Connections: Not on file     Family History: The patient's family history includes Diabetes in his father; Fibromyalgia in his sister; Healthy in his mother; Prostate cancer in his father. ROS:   Please see the history of present illness.    All 14 point review of systems negative except as described per history of present illness.  EKGs/Labs/Other Studies Reviewed:    The following studies were reviewed today:   EKG:  EKG is  ordered today.  The ekg ordered today demonstrates normal sinus rhythm, normal P interval, normal QS complex duration morphology  Recent Labs: 04/30/2020: ALT 37; BUN 18; Creat 1.07; Potassium 4.0; Sodium 137  Recent Lipid Panel No results found for: CHOL, TRIG, HDL, CHOLHDL, VLDL, LDLCALC, LDLDIRECT  Physical Exam:    VS:  BP (!) 158/96 (BP Location: Right Arm, Patient Position: Sitting)    Pulse 87    Ht 5\' 9"  (1.753 m)    Wt 232 lb (105.2 kg)    SpO2 98%    BMI 34.26 kg/m     Wt Readings from Last 3 Encounters:  05/26/20 232 lb (105.2 kg)  04/30/20 220 lb (99.8 kg)     GEN:  Well nourished, well developed in no acute distress HEENT: Normal NECK: No JVD; No carotid bruits LYMPHATICS: No lymphadenopathy CARDIAC: RRR, no murmurs, no rubs, no gallops RESPIRATORY:  Clear to auscultation without rales, wheezing or rhonchi  ABDOMEN: Soft, non-tender, non-distended MUSCULOSKELETAL:  No edema; No deformity  SKIN: Warm and dry NEUROLOGIC:  Alert and oriented x 3 PSYCHIATRIC:  Normal affect   ASSESSMENT:    1. Palpitations   2. Chest pain of uncertain etiology   3. Dyspnea on exertion   4. Atypical chest pain    5. Dyslipidemia    PLAN:    In order of problems listed above:  1. Palpitations.  I will ask you to wear Zio patch for a week to see what kind of arrhythmia if any he is experiencing.  Denies have any dizziness or passing out.  I will not initiate any therapy.  As a part of evaluation echocardiogram will be done to check left ventricle ejection fraction. 2. Essential hypertension few visits in the emergency resulted with elevation of the blood pressure.  Today his blood pressure in the office is elevated however this is first visit in my office.  He does not have evidence of LVH on EKG however echocardiogram will help 14/03/21 to determine if he does  have significant LVH.  He does have blood pressure monitor at home he check it he did show me some results and those are usually between 1 84-1 45 systolic.  I think we catching his early stages of high blood pressure.  Hopefully with quitting drinking will be able to avoid need for medications but will simply monitor the situation at this stage. 3. Atypical chest pain not related to exercise I will not pursue ischemia work-up at this stage.  We will try to assess his risk factors and then decide about future approach. 4. Dyslipidemia: Fasting lipid profile will be done today.  He tells me also that multiple male family members have problem with the prostate he would like to have PSA checked which I will do. 5. We did talk about need to change his lifestyle.  We he understands very well the drinking is detrimental to him as well as smoking is trying to quit doing this.  He already got significantly number of alcohol drinks that he have every day.   Medication Adjustments/Labs and Tests Ordered: Current medicines are reviewed at length with the patient today.  Concerns regarding medicines are outlined above.  Orders Placed This Encounter  Procedures   EKG 12-Lead   No orders of the defined types were placed in this encounter.   Signed, Park Liter, MD, [facility]. 05/26/2020 9:12 AM    [place]

## 2020-05-27 ENCOUNTER — Telehealth: Payer: Self-pay

## 2020-05-27 LAB — LIPID PANEL
Chol/HDL Ratio: 4.5 ratio (ref 0.0–5.0)
Cholesterol, Total: 251 mg/dL — ABNORMAL HIGH (ref 100–199)
HDL: 56 mg/dL (ref >39)
LDL Chol Calc (NIH): 181 mg/dL — ABNORMAL HIGH (ref 0–99)
Triglycerides: 84 mg/dL (ref 0–149)
VLDL Cholesterol Cal: 14 mg/dL (ref 5–40)

## 2020-05-27 LAB — PSA: Prostate Specific Ag, Serum: 0.8 ng/mL (ref 0.0–4.0)

## 2020-05-27 NOTE — Telephone Encounter (Signed)
-----   Message from Georgeanna Lea, MD sent at 05/27/2020  8:30 AM EST ----- Cholesterol elevated, for now with diet and exercise.

## 2020-05-27 NOTE — Telephone Encounter (Signed)
Unable to reach or leave a message(VM full) to return my call. Will try again Monday

## 2020-05-31 NOTE — Telephone Encounter (Signed)
I called and again unable to reach the patient.

## 2020-06-02 DIAGNOSIS — R002 Palpitations: Secondary | ICD-10-CM

## 2020-06-22 ENCOUNTER — Telehealth: Payer: Self-pay

## 2020-06-22 NOTE — Telephone Encounter (Signed)
Patient notified of results and verbalized understanding.  

## 2020-06-22 NOTE — Telephone Encounter (Signed)
-----   Message from Park Liter, MD sent at 06/22/2020  2:03 PM EST ----- Monitor showed  lack of significant arrhythmia.

## 2020-06-23 ENCOUNTER — Other Ambulatory Visit: Payer: Self-pay

## 2020-06-23 ENCOUNTER — Ambulatory Visit (HOSPITAL_BASED_OUTPATIENT_CLINIC_OR_DEPARTMENT_OTHER)
Admission: RE | Admit: 2020-06-23 | Discharge: 2020-06-23 | Disposition: A | Discharge status: Home / Self Care | Payer: Self-pay | Source: Ambulatory Visit | Attending: Cardiology | Admitting: Cardiology

## 2020-06-23 DIAGNOSIS — R002 Palpitations: Secondary | ICD-10-CM | POA: Insufficient documentation

## 2020-06-23 LAB — ECHOCARDIOGRAM COMPLETE
Area-P 1/2: 5.97 cm2
S' Lateral: 3.73 cm

## 2020-07-27 ENCOUNTER — Ambulatory Visit: Payer: Self-pay | Admitting: Cardiology

## 2020-09-03 ENCOUNTER — Other Ambulatory Visit: Payer: Self-pay

## 2020-09-03 ENCOUNTER — Emergency Department (INDEPENDENT_AMBULATORY_CARE_PROVIDER_SITE_OTHER)
Admission: EM | Admit: 2020-09-03 | Discharge: 2020-09-03 | Disposition: A | Discharge status: Home / Self Care | Payer: 59 | Source: Home / Self Care

## 2020-09-03 DIAGNOSIS — H66002 Acute suppurative otitis media without spontaneous rupture of ear drum, left ear: Secondary | ICD-10-CM

## 2020-09-03 DIAGNOSIS — J0100 Acute maxillary sinusitis, unspecified: Secondary | ICD-10-CM

## 2020-09-03 MED ORDER — AMOXICILLIN 875 MG PO TABS: 875.0000 mg | ORAL_TABLET | Freq: Two times a day (BID) | ORAL | 0 refills | Status: DC

## 2020-09-03 NOTE — ED Provider Notes (Signed)
[name]    CSN: [id number] Arrival date & time: 09/03/20  0900      History   Chief Complaint Chief Complaint  Patient presents with  . Otalgia    bilateral    HPI Kyle Barnett is a 40 y.o. male.   HPI  Patient presents today with several days of bilateral ear pressure and ear pain.  Patient has a medical history significant for deviated septum, seasonal allergies eustachian tube dysfunction and recently diagnosed with a pituitary adenoma.  Patient reports that he did purchase some over-the-counter alcohol drops and instilled into both of his ears which made symptoms worse and cause dizziness.  He has been without fever has had persistent nasal congestion postnasal drip.  He has not taken anything for allergy related symptoms.   Past Medical History:  Diagnosis Date  . Adenoid hypertrophy 12/06/2017  . Anticonvulsant-induced dizziness 12/06/2018  . Anxiety 02/01/2018  . Anxiety, generalized 12/06/2018  . Atypical chest pain 05/26/2020  . 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 Deviated nasal septum 12/06/2017  . Dyslipidemia 05/12/2013  . Dyspnea on exertion 05/26/2020  . Eustachian tube dysfunction, bilateral 08/10/2016  . Fullness in ear, bilateral 04/30/2020  . Hyperglycemia 04/19/2018  . Hyperlipidemia 05/12/2013  . Hypertrophy of inferior nasal turbinate 12/06/2017  . Infected blister of toe of right foot 01/17/2019  . Moderate episode of recurrent major depressive disorder ([place]) 07/13/2016  . Otalgia of both ears 04/30/2020  . Other chronic sinusitis 01/02/2018  . Palpitations 05/26/2020  . Perennial allergic rhinitis 07/26/2013  . Pituitary microadenoma ([place]) 02/13/2019  . Prediabetes 02/13/2019  . Prehypertension 04/19/2018  . Seasonal allergic rhinitis due to pollen 12/06/2017  . Tinea versicolor 07/13/2016  . 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 Palpitations 05/26/2020  . Dyspnea on exertion  05/26/2020  . Atypical chest pain 05/26/2020  . Fullness in ear, bilateral 04/30/2020  . Otalgia of both ears 04/30/2020  . Prediabetes 02/13/2019  . Pituitary microadenoma ([place]) 02/13/2019  . Infected blister of toe of right foot 01/17/2019  . Anticonvulsant-induced dizziness 12/06/2018  . Anxiety, generalized 12/06/2018  . Hyperglycemia 04/19/2018  . Prehypertension 04/19/2018  . Anxiety 02/01/2018  . Other chronic sinusitis 01/02/2018  . Adenoid hypertrophy 12/06/2017  . Deviated nasal septum 12/06/2017  . Hypertrophy of inferior nasal turbinate 12/06/2017  . Seasonal allergic rhinitis due to pollen 12/06/2017  . Eustachian tube dysfunction, bilateral 08/10/2016  . Moderate episode of recurrent major depressive disorder ([place]) 07/13/2016  . Tinea versicolor 07/13/2016  . 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 Perennial allergic rhinitis 07/26/2013  . Dyslipidemia 05/12/2013  . Hyperlipidemia 05/12/2013    History reviewed. No pertinent surgical history.     Home Medications    Prior to Admission medications   Medication Sig Start Date End Date Taking? Authorizing Provider  ammonium lactate (AMLACTIN) 12 % cream Apply topically as needed for dry skin. 05/20/20   Trula Slade, DPM  chlorhexidine (PERIDEX) 0.12 % solution 15 mLs 2 (two) times daily. 12/27/18   [provider]  escitalopram (LEXAPRO) 10 MG tablet Take 10 mg by mouth daily.    [provider]  lamoTRIgine (LAMICTAL) 25 MG tablet Take 50 mg by mouth at bedtime. 02/08/20   [provider]    Family History Family History  Problem Relation Age of Onset  . Healthy Mother   . Diabetes Father   .  Prostate cancer Father   . Fibromyalgia Sister     Social History Social History   Tobacco Use  . Smoking status: Current Every Day Smoker    Packs/day: 0.75    Types: Cigarettes  . Smokeless tobacco: Never Used  Vaping Use  . Vaping Use: Never used  Substance  Use Topics  . Alcohol use: Yes    Alcohol/week: 70.0 standard drinks    Types: 70 Standard drinks or equivalent per week    Comment: white claws (6-12 night) x 2.5 months  . Drug use: Never     Allergies   Clindamycin/lincomycin, Lorazepam, and Fluticasone   Review of Systems Review of Systems Pertinent negatives listed in HPI  Physical Exam Triage Vital Signs ED Triage Vitals  Enc Vitals Group     BP 09/03/20 0914 138/84     Pulse Rate 09/03/20 0914 92     Resp 09/03/20 0914 17     Temp 09/03/20 0914 99.5 F (37.5 C)     Temp Source 09/03/20 0914 Oral     SpO2 09/03/20 0914 100 %     Weight --      Height --      Head Circumference --      Peak Flow --      Pain Score 09/03/20 0917 6     Pain Loc --      Pain Edu? --      Excl. in [place]? --    No data found.  Updated Vital Signs BP 138/84 (BP Location: Left Arm)   Pulse 92   Temp 99.5 F (37.5 C) (Oral)   Resp 17   SpO2 100%   Visual Acuity Right Eye Distance:   Left Eye Distance:   Bilateral Distance:    Right Eye Near:   Left Eye Near:    Bilateral Near:     Physical Exam  General Appearance:    Alert, cooperative, no distress  HENT:   Normocephalic, Right ear MEE, Left Ear TM erythematou with MEE, B/L canal inflammation present  normal, nares mucosal edema with congestion, rhinorrhea, oropharynx clear     Eyes:    PERRL, conjunctiva/corneas clear, EOM's intact       Lungs:     Clear to auscultation bilaterally, respirations unlabored  Heart:    Regular rate and rhythm  Neurologic:   Awake, alert, oriented x 3. No apparent focal neurological           defect.     UC Treatments / Results  Labs (all labs ordered are listed, but only abnormal results are displayed) Labs Reviewed - No data to display  EKG   Radiology No results found.  Procedures Procedures (including critical care time)  Medications Ordered in UC Medications - No data to display  Initial Impression / Assessment and Plan  / UC Course  I have reviewed the triage vital signs and the nursing notes.  Pertinent labs & imaging results that were available during my care of the patient were reviewed by me and considered in my medical decision making (see chart for details).    Acute otitis media involving the left ear without spontaneous rupture of the membrane and acute sinusitis, treatment with amoxicillin 875 twice daily for 10 days.  Recommend adding antihistamine with Zyrtec or Claritin.  PCP follow-up as needed. Final Clinical Impressions(s) / UC Diagnoses   Final diagnoses:  Acute suppurative otitis media of left ear without spontaneous rupture of tympanic membrane, recurrence not  specified  Acute non-recurrent maxillary sinusitis     Discharge Instructions     Start amoxicillin 875 take 1 tablet twice daily for total of 10 days.  This is for treatment of both your ear infection and sinus infection.  Also recommend adding an over the counter antihistamine such as Zyrtec, Claritin or Xyzal.    ED Prescriptions    Medication Sig Dispense Auth. Provider   amoxicillin (AMOXIL) 875 MG tablet Take 1 tablet (875 mg total) by mouth 2 (two) times daily. 20 tablet Scot Jun, FNP     PDMP not reviewed this encounter.   Scot Jun, FNP 09/03/20 1029

## 2020-09-03 NOTE — Discharge Instructions (Signed)
Start amoxicillin 875 take 1 tablet twice daily for total of 10 days.  This is for treatment of both your ear infection and sinus infection.  Also recommend adding an over the counter antihistamine such as Zyrtec, Claritin or Xyzal.

## 2020-09-03 NOTE — ED Triage Notes (Signed)
Pt c/o RT ear pain x 1 week. Lt ear was hurting as well but has resolved. Put alcohol drops in ear yesterday which states made him feel dizzy. Pain 6/10 Hx of season allergies and sinus infections.

## 2020-09-29 ENCOUNTER — Encounter: Payer: Self-pay | Admitting: Cardiology

## 2020-09-29 ENCOUNTER — Other Ambulatory Visit: Payer: Self-pay

## 2020-09-29 ENCOUNTER — Ambulatory Visit (INDEPENDENT_AMBULATORY_CARE_PROVIDER_SITE_OTHER): Payer: 59 | Admitting: Cardiology

## 2020-09-29 VITALS — BP 128/78 | HR 84 | Ht 70.0 in | Wt 221.0 lb

## 2020-09-29 DIAGNOSIS — E785 Hyperlipidemia, unspecified: Secondary | ICD-10-CM | POA: Diagnosis not present

## 2020-09-29 DIAGNOSIS — R739 Hyperglycemia, unspecified: Secondary | ICD-10-CM

## 2020-09-29 DIAGNOSIS — M79669 Pain in unspecified lower leg: Secondary | ICD-10-CM

## 2020-09-29 DIAGNOSIS — M79606 Pain in leg, unspecified: Secondary | ICD-10-CM | POA: Insufficient documentation

## 2020-09-29 DIAGNOSIS — F419 Anxiety disorder, unspecified: Secondary | ICD-10-CM

## 2020-09-29 DIAGNOSIS — M79605 Pain in left leg: Secondary | ICD-10-CM

## 2020-09-29 DIAGNOSIS — R002 Palpitations: Secondary | ICD-10-CM

## 2020-09-29 NOTE — Telephone Encounter (Signed)
After discussing with Dr. Agustin Cree patient called and scheduled to come to office today.

## 2020-09-29 NOTE — Progress Notes (Signed)
Cardiology Office Note:    Date:  09/29/2020   ID:  Kyle Barnett, DOB 03/02/1981, MRN [id number]  PCP:  Kyle Hartshorn, NP  Cardiologist:  Kyle Campus, MD    Referring MD: Kyle Hartshorn, NP   Chief Complaint  Patient presents with  . chest and calf pain     X3 weeks with fatigue     History of Present Illness:    Kyle Barnett is a 40 y.o. male with past medical history significant for palpitations, monitoring however he was unrevealing, essential hypertension, atypical chest pain.  He called yesterday he requested to be seen.  He is worried about past eventually having blood clot in his legs.  Also complained of having atypical chest pain pain that he described is very short lasting only for split-second located in the middle of the chest not related to exercise.  There is no sweating no shortness of breath associated with this sensation.  Very atypical.  It does not happen with exercises.  He walks a lot he does about 15,000 steps every single day have no symptoms while doing it.  Would like him to think about leg blood clot is the fact that he have some pain in his calf.  I explained to him what typical presentation of DVT is which include swelling of the leg with some tenderness which she does not.  But he still worried too much about potentially having blood clot in his legs.  Past Medical History:  Diagnosis Date  . Adenoid hypertrophy 12/06/2017  . Anticonvulsant-induced dizziness 12/06/2018  . Anxiety 02/01/2018  . Anxiety, generalized 12/06/2018  . Atypical chest pain 05/26/2020  . 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 Deviated nasal septum 12/06/2017  . Dyslipidemia 05/12/2013  . Dyspnea on exertion 05/26/2020  . Eustachian tube dysfunction, bilateral 08/10/2016  . Fullness in ear, bilateral 04/30/2020  . Hyperglycemia 04/19/2018  . Hyperlipidemia 05/12/2013  . Hypertrophy of inferior nasal turbinate 12/06/2017  . Infected blister of toe of right foot 01/17/2019  .  Moderate episode of recurrent major depressive disorder ([place]) 07/13/2016  . Otalgia of both ears 04/30/2020  . Other chronic sinusitis 01/02/2018  . Palpitations 05/26/2020  . Perennial allergic rhinitis 07/26/2013  . Pituitary microadenoma ([place]) 02/13/2019  . Prediabetes 02/13/2019  . Prehypertension 04/19/2018  . Seasonal allergic rhinitis due to pollen 12/06/2017  . Tinea versicolor 07/13/2016  . 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 No pertinent surgical history.  Current Medications: Current Meds  Medication Sig  . escitalopram (LEXAPRO) 10 MG tablet Take 10 mg by mouth daily.  Marland Kitchen lamoTRIgine (LAMICTAL) 25 MG tablet Take 50 mg by mouth at bedtime.     Allergies:   Clindamycin/lincomycin, Lorazepam, and Fluticasone   Social History   Socioeconomic History  . Marital status: Single    Spouse name: Not on file  . Number of children: Not on file  . Years of education: Not on file  . Highest education level: Not on file  Occupational History  . Not on file  Tobacco Use  . Smoking status: Current Every Day Smoker    Packs/day: 0.75    Types: Cigarettes  . Smokeless tobacco: Never Used  Vaping Use  . Vaping Use: Never used  Substance and Sexual Activity  . Alcohol use: Yes    Alcohol/week: 70.0 standard drinks    Types: 70 Standard drinks or equivalent per week    Comment: white claws (6-12 night) x 2.5  months  . Drug use: Never  . Sexual activity: Yes    Birth control/protection: None  Other Topics Concern  . Not on file  Social History Narrative  . Not on file   Social Determinants of Health   Financial Resource Strain: Not on file  Food Insecurity: Not on file  Transportation Needs: Not on file  Physical Activity: Not on file  Stress: Not on file  Social Connections: Not on file     Family History: The patient's family history includes Diabetes in his father; Fibromyalgia in his sister; Healthy in his mother; Prostate cancer in  his father. ROS:   Please see the history of present illness.    All 14 point review of systems negative except as described per history of present illness  EKGs/Labs/Other Studies Reviewed:      Recent Labs: 04/30/2020: ALT 37; BUN 18; Creat 1.07; Potassium 4.0; Sodium 137  Recent Lipid Panel    Component Value Date/Time   CHOL 251 (H) 05/26/2020 0924   TRIG 84 05/26/2020 0924   HDL 56 05/26/2020 0924   CHOLHDL 4.5 05/26/2020 0924   LDLCALC 181 (H) 05/26/2020 0924    Physical Exam:    VS:  BP 128/78 (BP Location: Right Arm, Patient Position: Sitting)   Pulse 84   Ht 5\' 10"  (1.778 m)   Wt 221 lb (100.2 kg)   SpO2 99%   BMI 31.71 kg/m     Wt Readings from Last 3 Encounters:  09/29/20 221 lb (100.2 kg)  05/26/20 232 lb (105.2 kg)  04/30/20 220 lb (99.8 kg)     GEN:  Well nourished, well developed in no acute distress HEENT: Normal NECK: No JVD; No carotid bruits LYMPHATICS: No lymphadenopathy CARDIAC: RRR, no murmurs, no rubs, no gallops RESPIRATORY:  Clear to auscultation without rales, wheezing or rhonchi  ABDOMEN: Soft, non-tender, non-distended MUSCULOSKELETAL:  No edema; No deformity  SKIN: Warm and dry LOWER EXTREMITIES: no swelling NEUROLOGIC:  Alert and oriented x 3 PSYCHIATRIC:  Normal affect   ASSESSMENT:    1. Calf pain, unspecified laterality   2. Dyslipidemia   3. Hyperglycemia   4. Pain of left lower extremity   5. Palpitations   6. Anxiety    PLAN:    In order of problems listed above:  1. Calf pain.  Again physical exam is unrevealing.  We will ask him to have D-dimer however I told him that have very low level suspicion that there is a problem.  He is very happy to have the test done which we will do. 2. Dyslipidemia he is on keto diet I will check his fasting lipid profile. 3. Hyperglycemia.  Again Chem-7 will be done. 4. Palpitations much better its not seems to be a problem with now. 5. Anxiety which I think is a contributing factor  to it.   Medication Adjustments/Labs and Tests Ordered: Current medicines are reviewed at length with the patient today.  Concerns regarding medicines are outlined above.  Orders Placed This Encounter  Procedures  . Lipid panel  . D-Dimer, Quantitative  . Hemoglobin A1c  . EKG 12-Lead   Medication changes: No orders of the defined types were placed in this encounter.   Signed, Park Liter, MD, [facility] 09/29/2020 9:53 AM    [place]

## 2020-09-29 NOTE — Patient Instructions (Signed)
Medication Instructions:  Your physician recommends that you continue on your current medications as directed. Please refer to the Current Medication list given to you today.  *If you need a refill on your cardiac medications before your next appointment, please call your pharmacy*   Lab Work: Your physician recommends that you return for lab work today: lipid, d dimer If you have labs (blood work) drawn today and your tests are completely normal, you will receive your results only by: Marland Kitchen MyChart Message (if you have MyChart) OR . A paper copy in the mail If you have any lab test that is abnormal or we need to change your treatment, we will call you to review the results.   Testing/Procedures: None   Follow-Up: At [facility], you and your health needs are our priority.  As part of our continuing mission to provide you with exceptional heart care, we have created designated Provider Care Teams.  These Care Teams include your primary Cardiologist (physician) and Advanced Practice Providers (APPs -  Physician Assistants and Nurse Practitioners) who all work together to provide you with the care you need, when you need it.  We recommend signing up for the patient portal called "MyChart".  Sign up information is provided on this After Visit Summary.  MyChart is used to connect with patients for Virtual Visits (Telemedicine).  Patients are able to view lab/test results, encounter notes, upcoming appointments, etc.  Non-urgent messages can be sent to your provider as well.   To learn more about what you can do with MyChart, go to [email or web address].    Your next appointment:   4 month(s)  The format for your next appointment:   In Person  Provider:   Jenne Campus, MD   Other Instructions

## 2020-09-30 LAB — HEMOGLOBIN A1C
Est. average glucose Bld gHb Est-mCnc: 117 mg/dL
Hgb A1c MFr Bld: 5.7 % — ABNORMAL HIGH (ref 4.8–5.6)

## 2020-09-30 LAB — LIPID PANEL
Chol/HDL Ratio: 4.3 ratio (ref 0.0–5.0)
Cholesterol, Total: 213 mg/dL — ABNORMAL HIGH (ref 100–199)
HDL: 49 mg/dL (ref >39)
LDL Chol Calc (NIH): 153 mg/dL — ABNORMAL HIGH (ref 0–99)
Triglycerides: 64 mg/dL (ref 0–149)
VLDL Cholesterol Cal: 11 mg/dL (ref 5–40)

## 2020-09-30 LAB — D-DIMER, QUANTITATIVE: D-DIMER: <0.2 mg/L FEU (ref 0.00–0.49)

## 2020-10-29 ENCOUNTER — Other Ambulatory Visit: Payer: Self-pay

## 2020-10-29 ENCOUNTER — Emergency Department
Admission: EM | Admit: 2020-10-29 | Discharge: 2020-10-29 | Disposition: A | Discharge status: Home / Self Care | Payer: 59 | Source: Home / Self Care

## 2020-10-29 DIAGNOSIS — J069 Acute upper respiratory infection, unspecified: Secondary | ICD-10-CM | POA: Diagnosis not present

## 2020-10-29 DIAGNOSIS — H6590 Unspecified nonsuppurative otitis media, unspecified ear: Secondary | ICD-10-CM | POA: Diagnosis not present

## 2020-10-29 MED ORDER — TRIAMCINOLONE ACETONIDE 55 MCG/ACT NA AERO: 2.0000 | INHALATION_SPRAY | Freq: Every day | NASAL | 0 refills | Status: DC

## 2020-10-29 NOTE — ED Provider Notes (Signed)
[name]    CSN: [id number] Arrival date & time: 10/29/20  0911      History   Chief Complaint Chief Complaint  Patient presents with  . Sinus congestion  . Otalgia    RT    HPI Kyle Barnett is a 40 y.o. male who presents due to his R ear still bothering him He had 2 telemed visits and was placed on Amoxicillin for sinusitis, but developed side effects and was switched to Cefdinir which he just started last night. Has had chills off and on, but no fever when he checked his temp. Has been a little fatigued. Did an in home test last night and was neg, but wants one done here.     Past Medical History:  Diagnosis Date  . Adenoid hypertrophy 12/06/2017  . Anticonvulsant-induced dizziness 12/06/2018  . Anxiety 02/01/2018  . Anxiety, generalized 12/06/2018  . Atypical chest pain 05/26/2020  . 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 Deviated nasal septum 12/06/2017  . Dyslipidemia 05/12/2013  . Dyspnea on exertion 05/26/2020  . Eustachian tube dysfunction, bilateral 08/10/2016  . Fullness in ear, bilateral 04/30/2020  . Hyperglycemia 04/19/2018  . Hyperlipidemia 05/12/2013  . Hypertrophy of inferior nasal turbinate 12/06/2017  . Infected blister of toe of right foot 01/17/2019  . Moderate episode of recurrent major depressive disorder ([place]) 07/13/2016  . Otalgia of both ears 04/30/2020  . Other chronic sinusitis 01/02/2018  . Palpitations 05/26/2020  . Perennial allergic rhinitis 07/26/2013  . Pituitary microadenoma ([place]) 02/13/2019  . Prediabetes 02/13/2019  . Prehypertension 04/19/2018  . Seasonal allergic rhinitis due to pollen 12/06/2017  . Tinea versicolor 07/13/2016  . 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 Leg pain 09/29/2020  . Palpitations 05/26/2020  . Dyspnea on exertion 05/26/2020  . Atypical chest pain 05/26/2020  . Fullness in ear, bilateral 04/30/2020  . Otalgia of both ears 04/30/2020  .  Prediabetes 02/13/2019  . Pituitary microadenoma ([place]) 02/13/2019  . Infected blister of toe of right foot 01/17/2019  . Anticonvulsant-induced dizziness 12/06/2018  . Anxiety, generalized 12/06/2018  . Hyperglycemia 04/19/2018  . Prehypertension 04/19/2018  . Anxiety 02/01/2018  . Other chronic sinusitis 01/02/2018  . Adenoid hypertrophy 12/06/2017  . Deviated nasal septum 12/06/2017  . Hypertrophy of inferior nasal turbinate 12/06/2017  . Seasonal allergic rhinitis due to pollen 12/06/2017  . Eustachian tube dysfunction, bilateral 08/10/2016  . Moderate episode of recurrent major depressive disorder ([place]) 07/13/2016  . Tinea versicolor 07/13/2016  . 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 Perennial allergic rhinitis 07/26/2013  . Dyslipidemia 05/12/2013  . Hyperlipidemia 05/12/2013    History reviewed. No pertinent surgical history.     Home Medications    Prior to Admission medications   Medication Sig Start Date End Date Taking? Authorizing Provider  escitalopram (LEXAPRO) 10 MG tablet Take 10 mg by mouth daily.    [provider]  lamoTRIgine (LAMICTAL) 25 MG tablet Take 50 mg by mouth at bedtime. 02/08/20   [provider]    Family History Family History  Problem Relation Age of Onset  . Healthy Mother   . Diabetes Father   . Prostate cancer Father   . Fibromyalgia Sister     Social History Social History   Tobacco Use  . Smoking status: Current Every Day Smoker    Packs/day: 0.75    Types: Cigarettes  . Smokeless tobacco:  Never Used  Vaping Use  . Vaping Use: Never used  Substance Use Topics  . Alcohol use: Yes    Alcohol/week: 70.0 standard drinks    Types: 70 Standard drinks or equivalent per week    Comment: white claws (6-12 night) x 2.5 months  . Drug use: Never     Allergies   Clindamycin/lincomycin, Lorazepam, and Fluticasone   Review of Systems Review of Systems + for post nasal drainage, R ear pain,  fatigue, chills. The rest is neg   Physical Exam Triage Vital Signs ED Triage Vitals  Enc Vitals Group     BP 10/29/20 0925 (!) 144/77     Pulse Rate 10/29/20 0925 88     Resp 10/29/20 0925 17     Temp 10/29/20 0925 98.5 F (36.9 C)     Temp Source 10/29/20 0925 Oral     SpO2 10/29/20 0925 99 %     Weight --      Height --      Head Circumference --      Peak Flow --      Pain Score 10/29/20 0929 5     Pain Loc --      Pain Edu? --      Excl. in [place]? --    No data found.  Updated Vital Signs BP (!) 144/77 (BP Location: Right Arm)   Pulse 88   Temp 98.5 F (36.9 C) (Oral)   Resp 17   SpO2 99%   Visual Acuity Right Eye Distance:   Left Eye Distance:   Bilateral Distance:    Right Eye Near:   Left Eye Near:    Bilateral Near:     Physical Exam Vitals and nursing note reviewed.  Constitutional:      General: He is not in acute distress.    Appearance: He is not toxic-appearing.  HENT:     Head: Normocephalic.     Right Ear: Ear canal and external ear normal.     Left Ear: Ear canal and external ear normal.     Ears:     Comments: Both TMs are dull and gray    Nose: Nose normal.     Mouth/Throat:     Mouth: Mucous membranes are moist.  Eyes:     General: No scleral icterus.    Conjunctiva/sclera: Conjunctivae normal.  Cardiovascular:     Rate and Rhythm: Normal rate and regular rhythm.     Heart sounds: No murmur heard.   Pulmonary:     Effort: Pulmonary effort is normal.     Breath sounds: Normal breath sounds.  Musculoskeletal:        General: Normal range of motion.     Cervical back: Neck supple.  Lymphadenopathy:     Cervical: No cervical adenopathy.  Skin:    General: Skin is warm and dry.     Findings: No rash.  Neurological:     Mental Status: He is alert and oriented to person, place, and time.     Gait: Gait normal.  Psychiatric:        Mood and Affect: Mood normal.        Behavior: Behavior normal.        Thought Content: Thought  content normal.        Judgment: Judgment normal.      UC Treatments / Results  Labs (all labs ordered are listed, but only abnormal results are displayed) Labs Reviewed - No data to  display  EKG   Radiology No results found.  Procedures Procedures (including critical care time)  Medications Ordered in UC Medications - No data to display  Initial Impression / Assessment and Plan / UC Course  I have reviewed the triage vital signs and the nursing notes. URI and BSOM I placed him on Nasocort and advised him to finish the cefdinir.  Final Clinical Impressions(s) / UC Diagnoses   Final diagnoses:  None   Discharge Instructions   None    ED Prescriptions    None     PDMP not reviewed this encounter.   Shelby Mattocks, PA-C 10/29/20 1047

## 2020-10-29 NOTE — ED Triage Notes (Signed)
Pt c/o sinus congestion and RT ear pain x 3-4 days. Telemed visit Tues. Amoxicillin rx'd then switched to [facility]. Chills and fatigue yesterday. Denies fever. Pain 5/10

## 2020-10-29 NOTE — Discharge Instructions (Addendum)
Use the nasal spray for 7 days and finish your current antibiotic til gone.  We will inform you if your covid test is positive.

## 2020-10-30 LAB — SARS-COV-2 RNA,(COVID-19) QUALITATIVE NAAT: SARS CoV2 RNA: NOT DETECTED

## 2020-11-25 ENCOUNTER — Emergency Department (INDEPENDENT_AMBULATORY_CARE_PROVIDER_SITE_OTHER)
Admission: EM | Admit: 2020-11-25 | Discharge: 2020-11-25 | Disposition: A | Discharge status: Home / Self Care | Payer: 59 | Source: Home / Self Care

## 2020-11-25 ENCOUNTER — Encounter: Payer: Self-pay | Admitting: Emergency Medicine

## 2020-11-25 ENCOUNTER — Other Ambulatory Visit: Payer: Self-pay

## 2020-11-25 DIAGNOSIS — R002 Palpitations: Secondary | ICD-10-CM

## 2020-11-25 DIAGNOSIS — F331 Major depressive disorder, recurrent, moderate: Secondary | ICD-10-CM

## 2020-11-25 DIAGNOSIS — H9209 Otalgia, unspecified ear: Secondary | ICD-10-CM

## 2020-11-25 NOTE — Discharge Instructions (Addendum)
Advised patient we will follow-up with him with CMP results once received.

## 2020-11-25 NOTE — ED Provider Notes (Signed)
[facility]    CSN: [id number] Arrival date & time: 11/25/20  1019      History   Chief Complaint Chief Complaint  Patient presents with  . Otalgia    HPI [name] is a 40 y.o. male.   HPI-year-old male presents with bilateral otalgia was treated for left middle ear infection on 09/03/20 and is here for recheck.  PMH significant for bilateral eustachian tube dysfunction, bilateral ear fullness, otalgia of both ears, and vertigo of central origin of both ears.  Additionally, patient request serological testing (CMP) as his psychiatrist would like to evaluate labs as he is taking Lamictal 50 mg nightly.  Past Medical History:  Diagnosis Date  . Adenoid hypertrophy 12/06/2017  . Anticonvulsant-induced dizziness 12/06/2018  . Anxiety 02/01/2018  . Anxiety, generalized 12/06/2018  . Atypical chest pain 05/26/2020  . 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 Deviated nasal septum 12/06/2017  . Dyslipidemia 05/12/2013  . Dyspnea on exertion 05/26/2020  . Eustachian tube dysfunction, bilateral 08/10/2016  . Fullness in ear, bilateral 04/30/2020  . Hyperglycemia 04/19/2018  . Hyperlipidemia 05/12/2013  . Hypertrophy of inferior nasal turbinate 12/06/2017  . Infected blister of toe of right foot 01/17/2019  . Moderate episode of recurrent major depressive disorder ([place]) 07/13/2016  . Otalgia of both ears 04/30/2020  . Other chronic sinusitis 01/02/2018  . Palpitations 05/26/2020  . Perennial allergic rhinitis 07/26/2013  . Pituitary microadenoma ([place]) 02/13/2019  . Prediabetes 02/13/2019  . Prehypertension 04/19/2018  . Seasonal allergic rhinitis due to pollen 12/06/2017  . Tinea versicolor 07/13/2016  . 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 Leg pain 09/29/2020  . Palpitations 05/26/2020  . Dyspnea on exertion 05/26/2020  . Atypical chest pain 05/26/2020  . Fullness in ear, bilateral 04/30/2020  . Otalgia of both ears  04/30/2020  . Prediabetes 02/13/2019  . Pituitary microadenoma ([place]) 02/13/2019  . Infected blister of toe of right foot 01/17/2019  . Anticonvulsant-induced dizziness 12/06/2018  . Anxiety, generalized 12/06/2018  . Hyperglycemia 04/19/2018  . Prehypertension 04/19/2018  . Anxiety 02/01/2018  . Other chronic sinusitis 01/02/2018  . Adenoid hypertrophy 12/06/2017  . Deviated nasal septum 12/06/2017  . Hypertrophy of inferior nasal turbinate 12/06/2017  . Seasonal allergic rhinitis due to pollen 12/06/2017  . Eustachian tube dysfunction, bilateral 08/10/2016  . Moderate episode of recurrent major depressive disorder ([place]) 07/13/2016  . Tinea versicolor 07/13/2016  . 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 Perennial allergic rhinitis 07/26/2013  . Dyslipidemia 05/12/2013  . Hyperlipidemia 05/12/2013    History reviewed. No pertinent surgical history.     Home Medications    Prior to Admission medications   Medication Sig Start Date End Date Taking? Authorizing Provider  escitalopram (LEXAPRO) 10 MG tablet Take 10 mg by mouth daily.   Yes [provider]  lamoTRIgine (LAMICTAL) 25 MG tablet Take 50 mg by mouth at bedtime. 02/08/20  Yes [provider]  triamcinolone (NASACORT) 55 MCG/ACT AERO nasal inhaler Place 2 sprays into the nose daily. 10/29/20  Yes Rodriguez-Southworth, Sunday Spillers, PA-C    Family History Family History  Problem Relation Age of Onset  . Healthy Mother   . Diabetes Father   . Prostate cancer Father   . Fibromyalgia Sister     Social History Social History   Tobacco Use  . Smoking status: Every Day    Packs/day: 0.75    Pack  years: 0.00    Types: Cigarettes  . Smokeless tobacco: Never  Vaping Use  . Vaping Use: Never used  Substance Use Topics  . Alcohol use: Yes    Alcohol/week: 70.0 standard drinks    Types: 70 Standard drinks or equivalent per week    Comment: white claws (6-12 night) x 2.5 months  . Drug use:  Never     Allergies   Clindamycin/lincomycin, Lorazepam, and Fluticasone   Review of Systems Review of Systems  HENT:  Positive for ear pain.   All other systems reviewed and are negative.   Physical Exam Triage Vital Signs ED Triage Vitals  Enc Vitals Group     BP 11/25/20 1046 (!) 143/89     Pulse Rate 11/25/20 1046 82     Resp --      Temp 11/25/20 1046 99.2 F (37.3 C)     Temp Source 11/25/20 1046 Oral     SpO2 11/25/20 1046 98 %     Weight --      Height --      Head Circumference --      Peak Flow --      Pain Score 11/25/20 1047 0     Pain Loc --      Pain Edu? --      Excl. in [place]? --    No data found.  Updated Vital Signs BP (!) 143/89 (BP Location: Right Arm)   Pulse 82   Temp 99.2 F (37.3 C) (Oral)   SpO2 98%      Physical Exam Vitals and nursing note reviewed.  Constitutional:      General: He is not in acute distress.    Appearance: Normal appearance. He is not ill-appearing.  HENT:     Head: Normocephalic and atraumatic.     Right Ear: Tympanic membrane, ear canal and external ear normal.     Left Ear: Tympanic membrane, ear canal and external ear normal.     Nose: Nose normal.     Mouth/Throat:     Mouth: Mucous membranes are moist.     Pharynx: Oropharynx is clear.  Eyes:     Extraocular Movements: Extraocular movements intact.     Conjunctiva/sclera: Conjunctivae normal.     Pupils: Pupils are equal, round, and reactive to light.  Cardiovascular:     Rate and Rhythm: Normal rate and regular rhythm.     Pulses: Normal pulses.     Heart sounds: Normal heart sounds.  Pulmonary:     Effort: Pulmonary effort is normal.     Breath sounds: Normal breath sounds.     Comments: No adventitious breath sounds noted Musculoskeletal:        General: Normal range of motion.     Cervical back: Normal range of motion and neck supple. No tenderness.  Lymphadenopathy:     Cervical: No cervical adenopathy.  Skin:    General: Skin is warm and  dry.  Neurological:     General: No focal deficit present.     Mental Status: He is alert and oriented to person, place, and time.  Psychiatric:        Mood and Affect: Mood normal.        Behavior: Behavior normal.     UC Treatments / Results  Labs (all labs ordered are listed, but only abnormal results are displayed) Labs Reviewed  COMPLETE METABOLIC PANEL WITH GFR    EKG   Radiology No results found.  Procedures  Procedures (including critical care time)  Medications Ordered in UC Medications - No data to display  Initial Impression / Assessment and Plan / UC Course  I have reviewed the triage vital signs and the nursing notes.  Pertinent labs & imaging results that were available during my care of the patient were reviewed by me and considered in my medical decision making (see chart for details).     MDM: 1.  Otalgia, unspecified laterality-advised patient to follow-up with PCP/ENT if otalgia worsens, unresolved. 2. Moderate episode of recurrent major depressive disorder-CMP ordered psychiatrist request as patient is currently on Lamictal 50 mg nightly.  Patient discharged home, hemodynamically stable Final Clinical Impressions(s) / UC Diagnoses   Final diagnoses:  Moderate episode of recurrent major depressive disorder (HCC)  Otalgia, unspecified laterality     Discharge Instructions      Advised patient we will follow-up with him with CMP results once received.     ED Prescriptions   None    PDMP not reviewed this encounter.   Eliezer Lofts, [place] 11/25/20 1144

## 2020-11-25 NOTE — ED Triage Notes (Signed)
Patient here for a recheck on his right ear infection.  Patient seen here about a week ago for this.  Patient has completed the medications.  Patient's psychiatrist is requesting a CMP bloodwork since he is taking Lamcital.

## 2020-11-26 LAB — COMPLETE METABOLIC PANEL WITH GFR
AG Ratio: 1.6 (calc) (ref 1.0–2.5)
ALT: 32 U/L (ref 9–46)
AST: 22 U/L (ref 10–40)
Albumin: 4.3 g/dL (ref 3.6–5.1)
Alkaline phosphatase (APISO): 59 U/L (ref 36–130)
BUN: 13 mg/dL (ref 7–25)
CO2: 23 mmol/L (ref 20–32)
Calcium: 9.7 mg/dL (ref 8.6–10.3)
Chloride: 106 mmol/L (ref 98–110)
Creat: 0.97 mg/dL (ref 0.60–1.35)
GFR, Est African American: 113 mL/min/{1.73_m2} (ref >=60)
GFR, Est Non African American: 97 mL/min/{1.73_m2} (ref >=60)
Globulin: 2.7 g/dL (calc) (ref 1.9–3.7)
Glucose, Bld: 93 mg/dL (ref 65–99)
Potassium: 4.3 mmol/L (ref 3.5–5.3)
Sodium: 139 mmol/L (ref 135–146)
Total Bilirubin: 0.4 mg/dL (ref 0.2–1.2)
Total Protein: 7 g/dL (ref 6.1–8.1)

## 2021-01-24 ENCOUNTER — Emergency Department (INDEPENDENT_AMBULATORY_CARE_PROVIDER_SITE_OTHER)
Admission: EM | Admit: 2021-01-24 | Discharge: 2021-01-24 | Disposition: A | Discharge status: Home / Self Care | Payer: Managed Care, Other (non HMO) | Source: Home / Self Care | Attending: Family Medicine | Admitting: Family Medicine

## 2021-01-24 ENCOUNTER — Other Ambulatory Visit: Payer: Self-pay

## 2021-01-24 DIAGNOSIS — L739 Follicular disorder, unspecified: Secondary | ICD-10-CM

## 2021-01-24 MED ORDER — DOXYCYCLINE HYCLATE 100 MG PO CAPS: 100.0000 mg | ORAL_CAPSULE | Freq: Two times a day (BID) | ORAL | 0 refills | Status: DC

## 2021-01-24 MED ORDER — ERYTHROMYCIN 2 % EX PADS: MEDICATED_PAD | CUTANEOUS | 0 refills | Status: AC

## 2021-01-24 NOTE — ED Provider Notes (Signed)
[name]    CSN: [id number] Arrival date & time: 01/24/21  1741      History   Chief Complaint Chief Complaint  Patient presents with   Skin Ulcer    Ruq    HPI Kyle Barnett is a 40 y.o. male.   HPI  Patient has an infection on his right upper abdomen skin.  He has a small nodule.  It has a white top on it.  It is tender.  He wants to know whether it was an insect bite, or what caused it.  He also has numerous tiny pustules over his forehead scalp and back of his neck.  His head is shaved.  He thinks that they are because his CPAP rubs on the skin. Patient has chronic eczema condition since a child.  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   Deviated nasal septum 12/06/2017   Dyslipidemia 05/12/2013   Dyspnea on exertion 05/26/2020   Eustachian tube dysfunction, bilateral 08/10/2016   Fullness in ear, bilateral 04/30/2020   Hyperglycemia 04/19/2018   Hyperlipidemia 05/12/2013   Hypertrophy of inferior nasal turbinate 12/06/2017   Infected blister of toe of right foot 01/17/2019   Moderate episode of recurrent major depressive disorder ([place]) 07/13/2016   Otalgia of both ears 04/30/2020   Other chronic sinusitis 01/02/2018   Palpitations 05/26/2020   Perennial allergic rhinitis 07/26/2013   Pituitary microadenoma ([place]) 02/13/2019   Prediabetes 02/13/2019   Prehypertension 04/19/2018   Seasonal allergic rhinitis due to pollen 12/06/2017   Tinea versicolor 07/13/2016   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    Patient Active Problem List   Diagnosis Date Noted   Leg pain 09/29/2020   Palpitations 05/26/2020   Dyspnea on exertion 05/26/2020   Atypical chest pain 05/26/2020   Prediabetes 02/13/2019   Pituitary microadenoma  ([place]) 02/13/2019   Infected blister of toe of right foot 01/17/2019   Anticonvulsant-induced dizziness 12/06/2018   Anxiety, generalized 12/06/2018   Hyperglycemia 04/19/2018   Prehypertension 04/19/2018   Other chronic sinusitis 01/02/2018   Adenoid hypertrophy 12/06/2017   Deviated nasal septum 12/06/2017   Hypertrophy of inferior nasal turbinate 12/06/2017   Seasonal allergic rhinitis due to pollen 12/06/2017   Eustachian tube dysfunction, bilateral 08/10/2016   Moderate episode of recurrent major depressive disorder ([place]) 07/13/2016   Tinea versicolor 07/13/2016   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 Authorizing Provider  doxycycline (VIBRAMYCIN) 100 MG capsule Take 1 capsule (100 mg total) by mouth 2 (two) times daily. 01/24/21  Yes Raylene Everts, MD  Erythromycin 2 % PADS Use pad to clean face and scalp twice a day to prevent breakouts 01/24/21  Yes Raylene Everts, MD  escitalopram (LEXAPRO) 10 MG tablet Take 10 mg by mouth daily.    [provider]  lamoTRIgine (LAMICTAL) 25 MG tablet Take 50 mg by mouth at bedtime. 02/08/20   [provider]    Family History Family History  Problem Relation Age  of Onset   Healthy Mother    Diabetes Father    Fibromyalgia Sister     Social History Social History   Tobacco Use   Smoking status: Every Day    Packs/day: 0.12    Years: 1.00    Pack years: 0.12    Types: Cigarettes   Smokeless tobacco: Never  Vaping Use   Vaping Use: Never used  Substance Use Topics   Alcohol use: Not Currently    Alcohol/week: 70.0 standard drinks    Types: 70 Standard drinks or equivalent per week    Comment: white claws (6-12 night) x 2.5 months   Drug use:  Never     Allergies   Clindamycin/lincomycin, Lorazepam, and Fluticasone   Review of Systems Review of Systems See HPI  Physical Exam Triage Vital Signs ED Triage Vitals  Enc Vitals Group     BP 01/24/21 1753 (!) 142/79     Pulse Rate 01/24/21 1753 84     Resp 01/24/21 1753 18     Temp 01/24/21 1753 99.1 F (37.3 C)     Temp Source 01/24/21 1753 Oral     SpO2 01/24/21 1753 98 %     Weight 01/24/21 1750 205 lb (93 kg)     Height 01/24/21 1750 '5\' 9"'$  (1.753 m)     Head Circumference --      Peak Flow --      Pain Score 01/24/21 1750 0     Pain Loc --      Pain Edu? --      Excl. in [place]? --    No data found.  Updated Vital Signs BP (!) 142/79 (BP Location: Right Arm)   Pulse 84   Temp 99.1 F (37.3 C) (Oral)   Resp 18   Ht '5\' 9"'$  (1.753 m)   Wt 93 kg   SpO2 98%   BMI 30.27 kg/m       Physical Exam Constitutional:      General: He is not in acute distress.    Appearance: Normal appearance. He is well-developed.  HENT:     Head: Normocephalic and atraumatic.     Right Ear: Tympanic membrane, ear canal and external ear normal.     Left Ear: Tympanic membrane, ear canal and external ear normal.     Nose: Nose normal.     Mouth/Throat:     Mouth: Mucous membranes are moist.     Pharynx: No posterior oropharyngeal erythema.  Eyes:     Conjunctiva/sclera: Conjunctivae normal.     Pupils: Pupils are equal, round, and reactive to light.  Cardiovascular:     Rate and Rhythm: Normal rate.  Pulmonary:     Effort: Pulmonary effort is normal. No respiratory distress.  Abdominal:     General: There is no distension.     Palpations: Abdomen is soft.  Musculoskeletal:        General: Normal range of motion.     Cervical back: Normal range of motion and neck supple.  Lymphadenopathy:     Cervical: No cervical adenopathy.  Skin:    General: Skin is warm and dry.     Comments: 1 cm nodule with 2 cm overlying erythema and tiny pustule.  Pustule was opened with a  25-gauge needle and small amount of purulence expressed.  Numerous pustules, 2 mm to 3 mm across forehead, scalp, and back of neck  Neurological:     Mental Status: He is alert.  Psychiatric:  Mood and Affect: Mood normal.        Behavior: Behavior normal.     UC Treatments / Results  Labs (all labs ordered are listed, but only abnormal results are displayed) Labs Reviewed - No data to display  EKG   Radiology No results found.  Procedures Procedures (including critical care time)  Medications Ordered in UC Medications - No data to display  Initial Impression / Assessment and Plan / UC Course  I have reviewed the triage vital signs and the nursing notes.  Pertinent labs & imaging results that were available during my care of the patient were reviewed by me and considered in my medical decision making (see chart for details).     For the folliculitis on his scalp he can try an erythromycin pad.  He is allergic to clindamycin.  I do not use anything with benzyl peroxide or salicylic acid because of his skin sensitivity in general.  I have also given him a week of doxycycline to clear up the in infections in his skin on his abdomen and his scalp.  Follow-up with dermatology Final Clinical Impressions(s) / UC Diagnoses   Final diagnoses:  Folliculitis     Discharge Instructions      Take antibiotic 2 times a day Follow-up with your dermatologist  I have prescribed an acne pad to use on your scalp to reduce breakouts under your CPAP   ED Prescriptions     Medication Sig Dispense Auth. Provider   doxycycline (VIBRAMYCIN) 100 MG capsule Take 1 capsule (100 mg total) by mouth 2 (two) times daily. 14 capsule Raylene Everts, MD   Erythromycin 2 % PADS Use pad to clean face and scalp twice a day to prevent breakouts 60 each Meda Coffee Jennette Banker, MD      PDMP not reviewed this encounter.   Raylene Everts, MD 01/24/21 Pauline Aus

## 2021-01-24 NOTE — ED Triage Notes (Signed)
Pt presents to [facility] with redness and pain on his ruq. Pt is concerened it may be a spider bite but it unsure. Pt has not put any creams or anything on it.

## 2021-01-24 NOTE — Discharge Instructions (Addendum)
Take antibiotic 2 times a day Follow-up with your dermatologist  I have prescribed an acne pad to use on your scalp to reduce breakouts under your CPAP

## 2021-02-15 ENCOUNTER — Emergency Department (INDEPENDENT_AMBULATORY_CARE_PROVIDER_SITE_OTHER)
Admission: EM | Admit: 2021-02-15 | Discharge: 2021-02-15 | Disposition: A | Discharge status: Home / Self Care | Payer: Managed Care, Other (non HMO) | Source: Home / Self Care

## 2021-02-15 ENCOUNTER — Ambulatory Visit: Payer: Managed Care, Other (non HMO)

## 2021-02-15 ENCOUNTER — Other Ambulatory Visit: Payer: Self-pay

## 2021-02-15 ENCOUNTER — Encounter: Payer: Self-pay | Admitting: Emergency Medicine

## 2021-02-15 DIAGNOSIS — Z20822 Contact with and (suspected) exposure to covid-19: Secondary | ICD-10-CM | POA: Diagnosis not present

## 2021-02-15 DIAGNOSIS — R5383 Other fatigue: Secondary | ICD-10-CM

## 2021-02-15 DIAGNOSIS — B349 Viral infection, unspecified: Secondary | ICD-10-CM

## 2021-02-15 NOTE — ED Triage Notes (Signed)
Chills &  fatigue  since finishing doxycyline a few days ago Pt would like blood work done  Pt is on Cpap at night- took it off during the night

## 2021-02-15 NOTE — Discharge Instructions (Addendum)
Advised patient conservative measures for now may alternate between OTC Tylenol 1000 mg 1-2 times daily, as needed with OTC Ibuprofen 800 mg 1-2 times daily, as needed.  Encouraged patient to increase daily water intake while taking these medications and that we would follow-up with him once lab results are returned.

## 2021-02-15 NOTE — ED Provider Notes (Signed)
[name]    CSN: [id number] Arrival date & time: 02/15/21  1016      History   Chief Complaint Chief Complaint  Patient presents with   Chills   Fatigue    HPI Kyle Barnett is a 40 y.o. male.   HPI 40 year old male presents with chills, fatigue, and fever for 3 days.  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   Deviated nasal septum 12/06/2017   Dyslipidemia 05/12/2013   Dyspnea on exertion 05/26/2020   Eustachian tube dysfunction, bilateral 08/10/2016   Fullness in ear, bilateral 04/30/2020   Hyperglycemia 04/19/2018   Hyperlipidemia 05/12/2013   Hypertrophy of inferior nasal turbinate 12/06/2017   Infected blister of toe of right foot 01/17/2019   Moderate episode of recurrent major depressive disorder ([place]) 07/13/2016   Otalgia of both ears 04/30/2020   Other chronic sinusitis 01/02/2018   Palpitations 05/26/2020   Perennial allergic rhinitis 07/26/2013   Pituitary microadenoma ([place]) 02/13/2019   Prediabetes 02/13/2019   Prehypertension 04/19/2018   Seasonal allergic rhinitis due to pollen 12/06/2017   Tinea versicolor 07/13/2016   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    Patient Active Problem List   Diagnosis Date Noted   Leg pain 09/29/2020   Palpitations 05/26/2020   Dyspnea on exertion 05/26/2020   Atypical chest pain 05/26/2020   Prediabetes 02/13/2019   Pituitary microadenoma ([place]) 02/13/2019   Infected blister of toe of right foot 01/17/2019   Anticonvulsant-induced dizziness 12/06/2018   Anxiety, generalized 12/06/2018   Hyperglycemia 04/19/2018   Prehypertension 04/19/2018   Other chronic sinusitis 01/02/2018   Adenoid hypertrophy 12/06/2017   Deviated nasal septum 12/06/2017   Hypertrophy of inferior nasal turbinate 12/06/2017   Seasonal  allergic rhinitis due to pollen 12/06/2017   Eustachian tube dysfunction, bilateral 08/10/2016   Moderate episode of recurrent major depressive disorder ([place]) 07/13/2016   Tinea versicolor 07/13/2016   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 Authorizing Provider  doxycycline (VIBRAMYCIN) 100 MG capsule Take 1 capsule (100 mg total) by mouth 2 (two) times daily. Patient not taking: Reported on 02/15/2021 01/24/21   Raylene Everts, MD  Erythromycin 2 % PADS Use pad to clean face and scalp twice a day to prevent breakouts 01/24/21   Raylene Everts, MD  escitalopram (LEXAPRO) 10 MG tablet Take 10 mg by mouth daily.    [provider]  lamoTRIgine (LAMICTAL) 25 MG tablet Take 50 mg by mouth daily. 02/08/20   [provider]    Family History Family History  Problem Relation Age of Onset   Healthy Mother    Diabetes Father    Fibromyalgia Sister     Social History Social History   Tobacco Use   Smoking status: Every Day    Packs/day: 0.12    Years: 1.00    Pack years: 0.12    Types: Cigarettes   Smokeless tobacco: Never  Vaping Use   Vaping Use: Never used  Substance Use Topics   Alcohol use: Not Currently  Alcohol/week: 70.0 standard drinks    Types: 70 Standard drinks or equivalent per week    Comment: white claws (6-12 night) x 2.5 months   Drug use: Never     Allergies   Clindamycin/lincomycin, Lorazepam, and Fluticasone   Review of Systems Review of Systems  Constitutional:  Positive for chills, fatigue and fever.  All other systems reviewed and are negative.   Physical Exam Triage Vital Signs ED Triage Vitals  Enc Vitals Group     BP 02/15/21 1100 118/80     Pulse Rate  02/15/21 1100 98     Resp 02/15/21 1100 16     Temp 02/15/21 1100 99.7 F (37.6 C)     Temp Source 02/15/21 1100 Oral     SpO2 02/15/21 1100 99 %     Weight 02/15/21 1057 203 lb (92.1 kg)     Height 02/15/21 1057 5\' 9"  (1.753 m)     Head Circumference --      Peak Flow --      Pain Score 02/15/21 1059 4     Pain Loc --      Pain Edu? --      Excl. in [place]? --    No data found.  Updated Vital Signs BP 118/80 (BP Location: Left Arm)   Pulse 98   Temp 99.7 F (37.6 C) (Oral)   Resp 16   Ht 5\' 9"  (1.753 m)   Wt 203 lb (92.1 kg)   SpO2 99%   BMI 29.98 kg/m       Physical Exam Vitals and nursing note reviewed.  Constitutional:      General: He is not in acute distress.    Appearance: Normal appearance. He is normal weight. He is not ill-appearing.  HENT:     Head: Normocephalic and atraumatic.     Right Ear: Tympanic membrane, ear canal and external ear normal.     Left Ear: Tympanic membrane, ear canal and external ear normal.     Nose: Nose normal. No congestion or rhinorrhea.     Mouth/Throat:     Mouth: Mucous membranes are moist.     Pharynx: Oropharynx is clear.  Eyes:     Extraocular Movements: Extraocular movements intact.     Conjunctiva/sclera: Conjunctivae normal.     Pupils: Pupils are equal, round, and reactive to light.  Cardiovascular:     Rate and Rhythm: Normal rate and regular rhythm.     Pulses: Normal pulses.     Heart sounds: Normal heart sounds.  Pulmonary:     Effort: Pulmonary effort is normal.     Breath sounds: Normal breath sounds.     Comments: No adventitious breath sounds noted Musculoskeletal:        General: Normal range of motion.     Cervical back: Normal range of motion and neck supple. No tenderness.  Lymphadenopathy:     Cervical: No cervical adenopathy.  Skin:    General: Skin is warm and dry.  Neurological:     General: No focal deficit present.     Mental Status: He is alert and oriented to person, place, and time.  Mental status is at baseline.  Psychiatric:        Mood and Affect: Mood normal.        Behavior: Behavior normal.        Thought Content: Thought content normal.     UC Treatments / Results  Labs (all labs ordered are listed, but only  abnormal results are displayed) Labs Reviewed  COVID-19, FLU A+B NAA    EKG   Radiology No results found.  Procedures Procedures (including critical care time)  Medications Ordered in UC Medications - No data to display  Initial Impression / Assessment and Plan / UC Course  I have reviewed the triage vital signs and the nursing notes.  Pertinent labs & imaging results that were available during my care of the patient were reviewed by me and considered in my medical decision making (see chart for details).     MDM: 1.  Fact with and suspected exposure to COVID-19-COVID-19 PCR flu A&B ordered; 2.  Fatigue, unspecified-COVID-19 PCR flu A&B ordered; 3.  Viral illness-Advised patient conservative measures for now may alternate between OTC Tylenol 1000 mg 1-2 times daily, as needed with OTC Ibuprofen 800 mg 1-2 times daily, as needed.  Encouraged patient to increase daily water intake while taking these medications and that we would follow-up with him once lab results are returned.  Discharged home, hemodynamically stable. Final Clinical Impressions(s) / UC Diagnoses   Final diagnoses:  Contact with and (suspected) exposure to covid-19  Fatigue, unspecified type  Viral illness     Discharge Instructions      Advised patient conservative measures for now may alternate between OTC Tylenol 1000 mg 1-2 times daily, as needed with OTC Ibuprofen 800 mg 1-2 times daily, as needed.  Encouraged patient to increase daily water intake while taking these medications and that we would follow-up with him once lab results are returned.     ED Prescriptions   None    PDMP not reviewed this encounter.   Eliezer Lofts, FNP 02/15/21 1213

## 2021-02-16 ENCOUNTER — Telehealth: Payer: Self-pay

## 2021-02-16 NOTE — Telephone Encounter (Signed)
Telephone call from pt stating he is continuing to have sx and fever. Pt requested work note be extended for today. Pt lab results still in process. Pt notified letter would be at front desk and reminded to bring photo ID at pick up.

## 2021-02-17 LAB — COVID-19, FLU A+B NAA
Influenza A, NAA: NOT DETECTED
Influenza B, NAA: NOT DETECTED
SARS-CoV-2, NAA: NOT DETECTED

## 2021-02-21 ENCOUNTER — Ambulatory Visit: Payer: Managed Care, Other (non HMO)

## 2021-02-23 ENCOUNTER — Ambulatory Visit: Payer: Managed Care, Other (non HMO) | Attending: Internal Medicine

## 2021-02-23 DIAGNOSIS — Z23 Encounter for immunization: Secondary | ICD-10-CM

## 2021-02-23 NOTE — Progress Notes (Signed)
   Covid-19 Vaccination Clinic  Name:  Kyle Barnett    MRN: [id number] DOB: 1981/04/10  02/23/2021  [name] was observed post Covid-19 immunization for 15 minutes without incident. He was provided with Vaccine Information Sheet and instruction to access the V-Safe system.   [name] was instructed to call 911 with any severe reactions post vaccine: Difficulty breathing  Swelling of face and throat  A fast heartbeat  A bad rash all over body  Dizziness and weakness

## 2021-03-04 ENCOUNTER — Other Ambulatory Visit (HOSPITAL_BASED_OUTPATIENT_CLINIC_OR_DEPARTMENT_OTHER): Payer: Self-pay

## 2021-03-04 MED ORDER — COVID-19MRNA BIVAL VACC PFIZER 30 MCG/0.3ML IM SUSP
INTRAMUSCULAR | 0 refills | Status: AC
  Filled 2021-03-04: qty 0.3, 1d supply, fill #0

## 2021-03-07 ENCOUNTER — Other Ambulatory Visit (HOSPITAL_BASED_OUTPATIENT_CLINIC_OR_DEPARTMENT_OTHER): Payer: Self-pay

## 2021-05-16 ENCOUNTER — Emergency Department (INDEPENDENT_AMBULATORY_CARE_PROVIDER_SITE_OTHER)
Admission: RE | Admit: 2021-05-16 | Discharge: 2021-05-16 | Disposition: A | Discharge status: Home / Self Care | Payer: Self-pay | Source: Ambulatory Visit

## 2021-05-16 ENCOUNTER — Ambulatory Visit: Payer: Self-pay

## 2021-05-16 ENCOUNTER — Emergency Department (INDEPENDENT_AMBULATORY_CARE_PROVIDER_SITE_OTHER): Payer: Self-pay

## 2021-05-16 ENCOUNTER — Other Ambulatory Visit: Payer: Self-pay

## 2021-05-16 DIAGNOSIS — S139XXA Sprain of joints and ligaments of unspecified parts of neck, initial encounter: Secondary | ICD-10-CM

## 2021-05-16 DIAGNOSIS — M549 Dorsalgia, unspecified: Secondary | ICD-10-CM

## 2021-05-16 DIAGNOSIS — S335XXA Sprain of ligaments of lumbar spine, initial encounter: Secondary | ICD-10-CM

## 2021-05-16 DIAGNOSIS — M542 Cervicalgia: Secondary | ICD-10-CM

## 2021-05-16 MED ORDER — DICLOFENAC SODIUM 50 MG PO TBEC: 50.0000 mg | DELAYED_RELEASE_TABLET | Freq: Two times a day (BID) | ORAL | 0 refills | Status: AC

## 2021-05-16 MED ORDER — BACLOFEN 10 MG PO TABS: 5.0000 mg | ORAL_TABLET | Freq: Three times a day (TID) | ORAL | 0 refills | Status: AC | PRN

## 2021-05-16 NOTE — Discharge Instructions (Addendum)
No abnormalities on x-rays. Take diclofenac as prescribed to help with pain - do not take ibuprofen/Advil or naproxen/Aleve with this. Take baclofen as prescribed as needed for stiffness and any muscle spasms, may cause drowsiness. Do gentle stretches as described. Follow-up with orthopedics for further management, especially if minimal improvement in a couple weeks.
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
## 2021-07-01 ENCOUNTER — Ambulatory Visit: Payer: Managed Care, Other (non HMO) | Admitting: Cardiology

## 2022-01-20 IMAGING — DX DG CERVICAL SPINE COMPLETE 4+V
7 series · 7 of 7 positions shown · non-contrast
Comparison: None.

CLINICAL DATA: Neck pain

EXAM:
CERVICAL SPINE - COMPLETE 4+ VIEW

[c-spine lat]
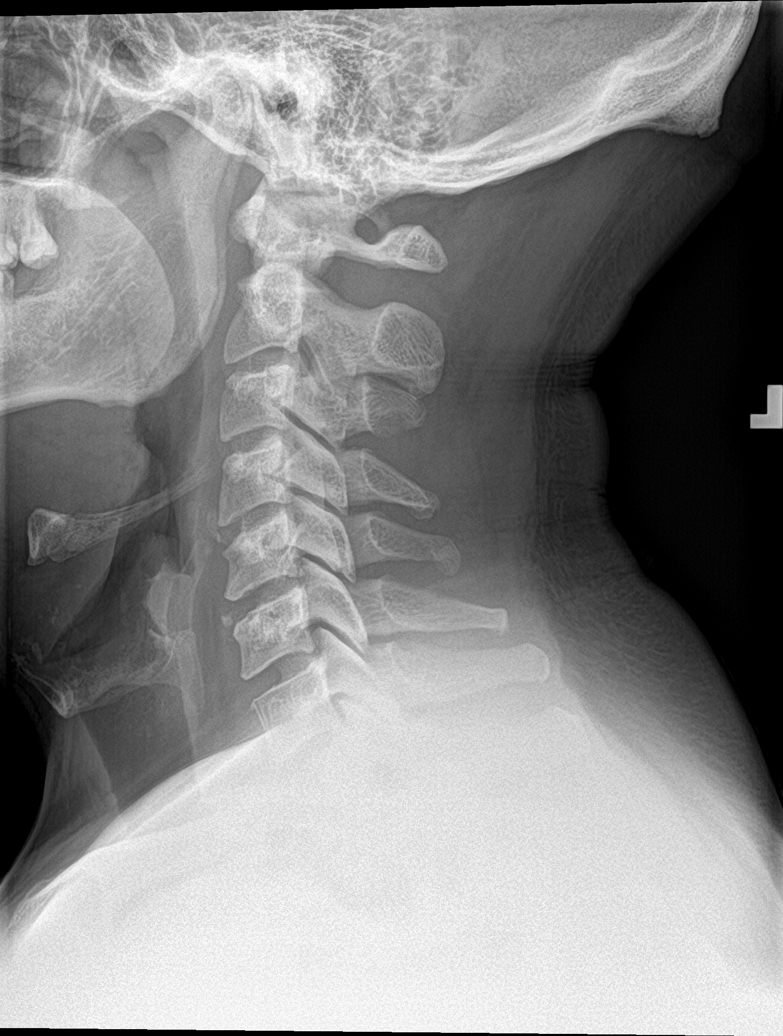

[c-spine obl (1 of 2)]
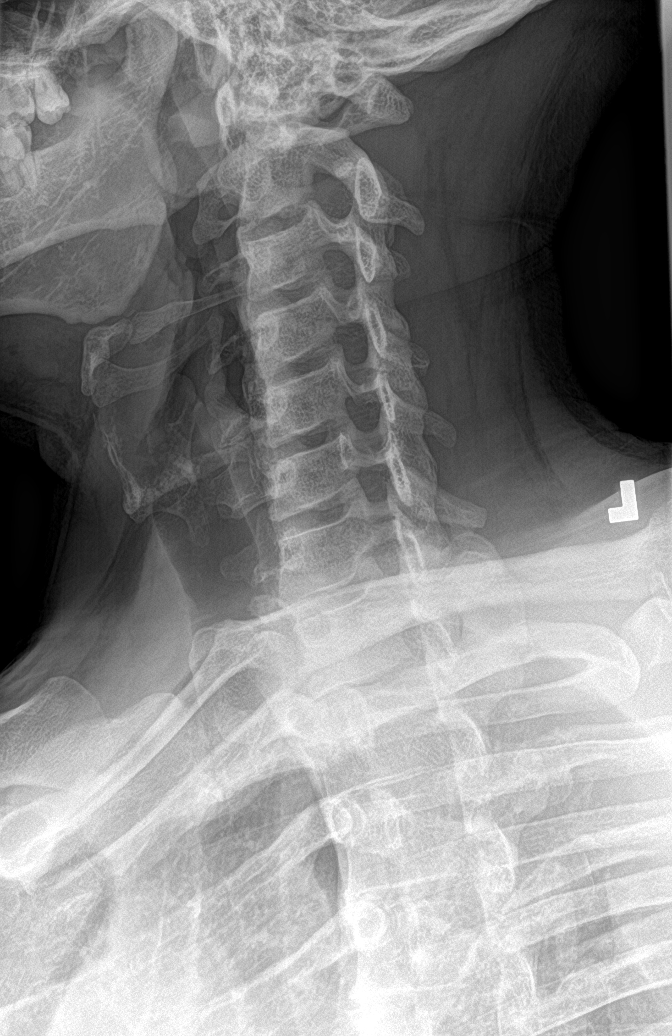

[c-spine obl (2 of 2)]
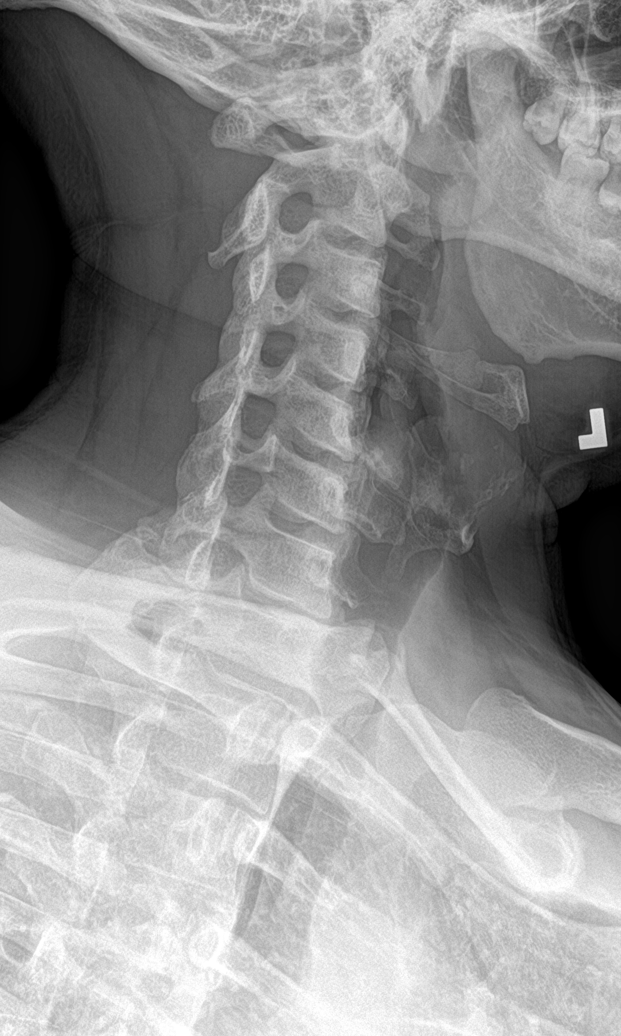

[c-spine ap (1 of 2)]
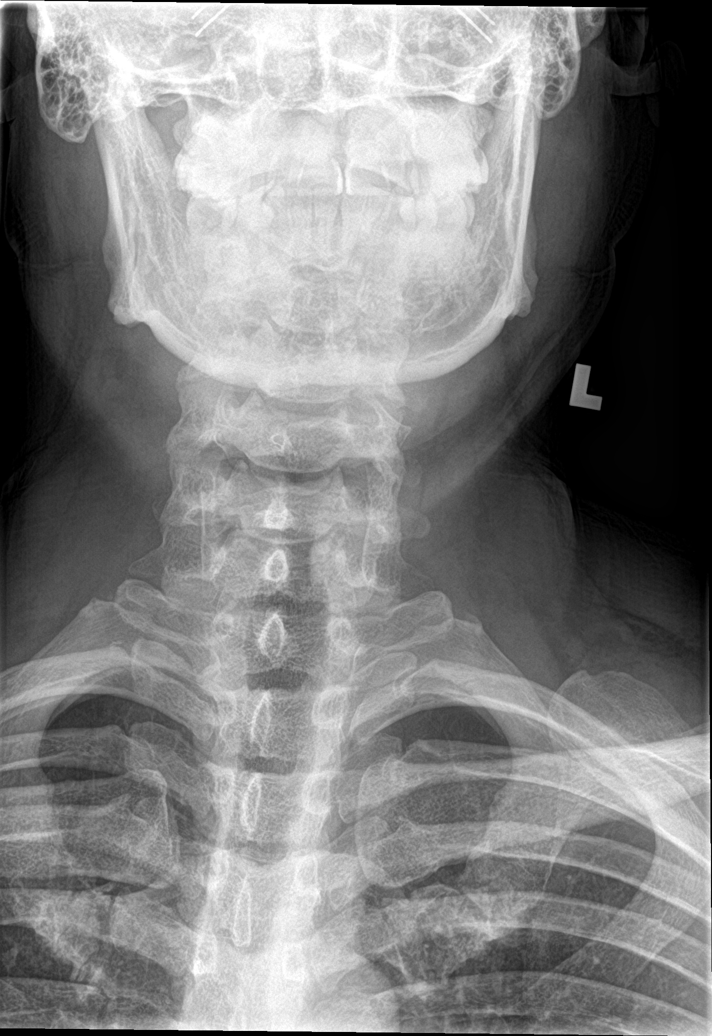

[c-spine open mouth]
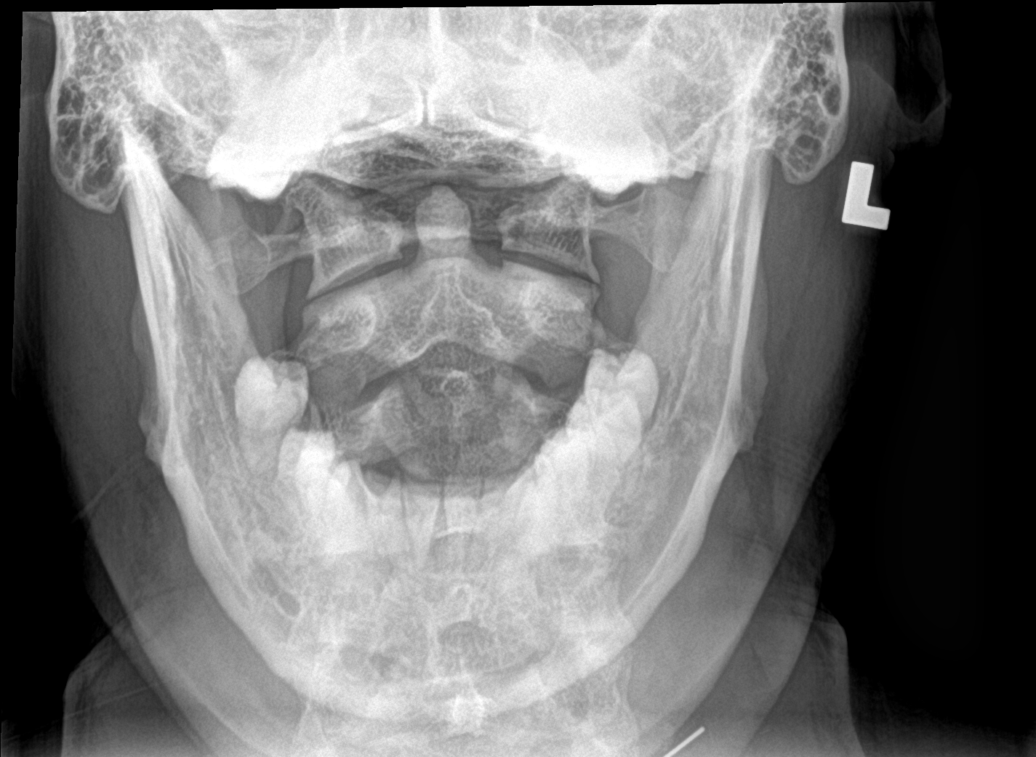

[c-spine swimmers]
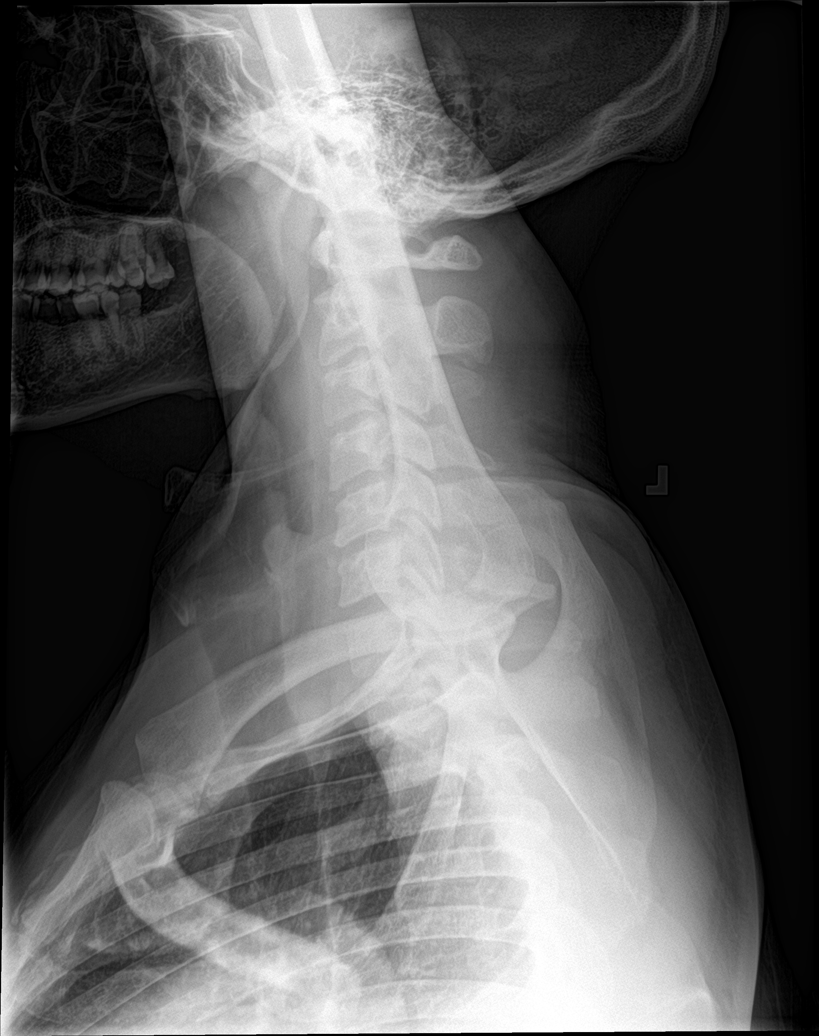

[c-spine ap (2 of 2)]
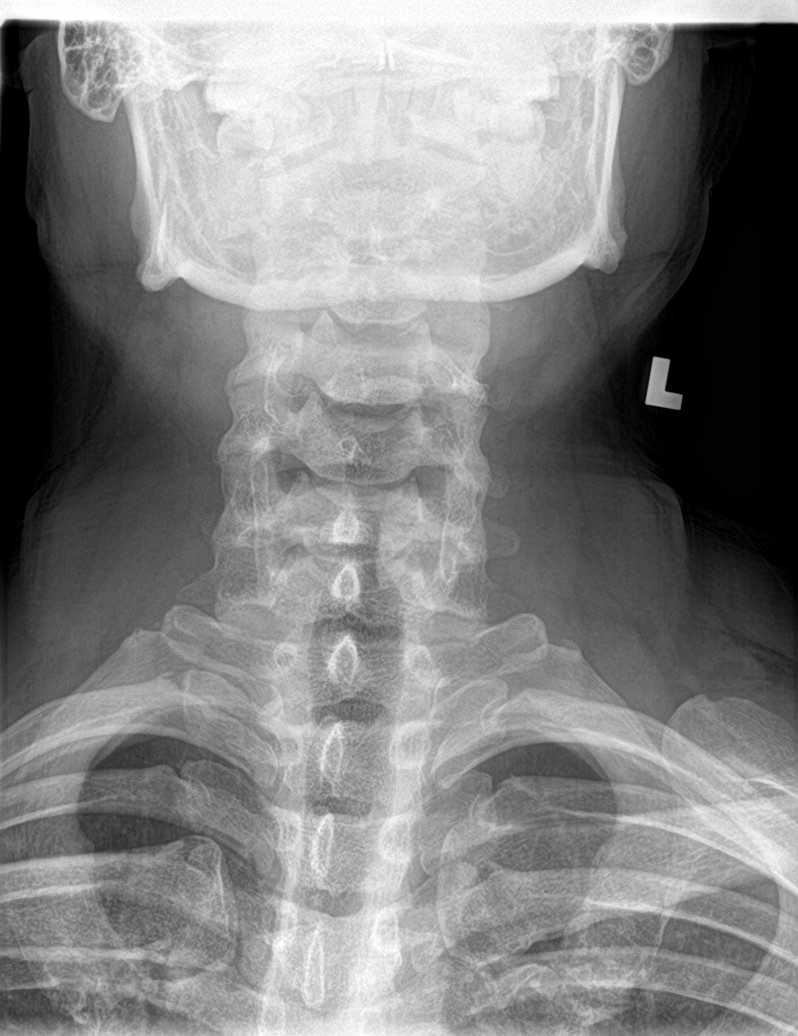

[7 of 7 positions shown; findings below may reference images not displayed]

FINDINGS: There is no evidence of cervical spine fracture or prevertebral soft
tissue swelling. Alignment is normal. No other significant bone
abnormalities are identified.
IMPRESSION: No acute fracture or malalignment identified.

## 2022-08-10 ENCOUNTER — Ambulatory Visit
Admission: RE | Admit: 2022-08-10 | Discharge: 2022-08-10 | Disposition: A | Discharge status: Home / Self Care | Payer: 59 | Source: Ambulatory Visit | Attending: Urgent Care | Admitting: Urgent Care

## 2022-08-10 VITALS — BP 139/83 | HR 86 | Temp 98.4°F | Resp 17

## 2022-08-10 DIAGNOSIS — L300 Nummular dermatitis: Secondary | ICD-10-CM

## 2022-08-10 DIAGNOSIS — H6993 Unspecified Eustachian tube disorder, bilateral: Secondary | ICD-10-CM

## 2022-08-10 DIAGNOSIS — H61893 Other specified disorders of external ear, bilateral: Secondary | ICD-10-CM

## 2022-08-10 DIAGNOSIS — H6122 Impacted cerumen, left ear: Secondary | ICD-10-CM | POA: Diagnosis not present

## 2022-08-10 MED ORDER — FLUOCINOLONE ACETONIDE 0.01 % OT OIL: 5.0000 [drp] | TOPICAL_OIL | Freq: Two times a day (BID) | OTIC | 0 refills | Status: AC

## 2022-08-10 MED ORDER — LEVOCETIRIZINE DIHYDROCHLORIDE 5 MG PO TABS: 5.0000 mg | ORAL_TABLET | Freq: Every evening | ORAL | 0 refills | Status: AC

## 2022-08-10 MED ORDER — TRIAMCINOLONE ACETONIDE 0.5 % EX OINT: 1.0000 | TOPICAL_OINTMENT | Freq: Two times a day (BID) | CUTANEOUS | 0 refills | Status: AC

## 2022-08-10 MED ORDER — PREDNISONE 50 MG PO TABS: 50.0000 mg | ORAL_TABLET | Freq: Every day | ORAL | 0 refills | Status: AC

## 2022-08-10 MED ORDER — IPRATROPIUM BROMIDE 0.03 % NA SOLN: 2.0000 | Freq: Two times a day (BID) | NASAL | 0 refills | Status: AC

## 2022-08-10 NOTE — ED Triage Notes (Signed)
Pt c/o bilateral ear pain since yesterday. States he was being tx for ear infection and finished abx today. But yesterday stuck a q tip in his ear due to itching and says its been irritated since.

## 2022-08-10 NOTE — Discharge Instructions (Addendum)
Your dermatitis appears to be a nummular dermatitis. Please apply topical Aquaphor to the affected regions. I did call in a topical steroid cream for you to use.  Please do not exceed 14 consecutive days of use.  You do not have an active ear infection.  I suspect this is secondary to eustachian tube disorder. Please start with taking the antihistamine and the nasal spray.  If you do not have significant improvement over the next several days, you may also start taking an oral steroid. Do not use the topical steroid while on the oral steroid.  Lastly, to help with your ear itchiness, I have called in fluocinolone oil.  You will use this on an as-needed basis, again not to exceed 14 consecutive days of use.

## 2022-08-10 NOTE — ED Provider Notes (Signed)
[facility]    CSN: [id number] Arrival date & time: 08/10/22  1601      History   Chief Complaint Chief Complaint  Patient presents with   Otalgia    Bilateral    HPI Kyle Barnett is a 42 y.o. male.   42 year old male presents today with concerns of bilateral ear pain.  He states his company at work has a [employer], [employer], and he has been treated 3 times by a telehealth provider for otitis media within the past 2 months.  He reports completion of 10 days of amoxicillin, followed by 10 days of Augmentin, followed by 10 days of doxycycline.  He states none of these antibiotics have helped with his ear pain.  He reports pressure on the inside, intermittently causing popping noises.  He denies any hearing loss.  He states that sometimes his ear canals are itchy.  He did use a Q-tip the other night to help with itching of his external auditory canal which did not improve the symptoms.  Apart from the antibiotics, no additional treatments have been tried.  He does admit to intermittent use of fluticasone nasal spray.  He does have a history of eustachian tube dysfunction diagnosed in 2018. Denies any pain in jaw, clenching or grinding. Additionally, patient is requesting treatment for a rash on his abdomen.  He states several months ago it was actually biopsied and came back as dermatitis.  He reports that the provider that performed the punch biopsy did not give him any recommendations or treatment options.  He reports the rash is around raised and scaly and itchy.   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   Deviated nasal septum 12/06/2017   Dyslipidemia 05/12/2013   Dyspnea on exertion 05/26/2020   Eustachian tube dysfunction, bilateral 08/10/2016   Fullness in ear,  bilateral 04/30/2020   Hyperglycemia 04/19/2018   Hyperlipidemia 05/12/2013   Hypertrophy of inferior nasal turbinate 12/06/2017   Infected blister of toe of right foot 01/17/2019   Moderate episode of recurrent major depressive disorder ([place]) 07/13/2016   Otalgia of both ears 04/30/2020   Other chronic sinusitis 01/02/2018   Palpitations 05/26/2020   Perennial allergic rhinitis 07/26/2013   Pituitary microadenoma ([place]) 02/13/2019   Prediabetes 02/13/2019   Prehypertension 04/19/2018   Seasonal allergic rhinitis due to pollen 12/06/2017   Tinea versicolor 07/13/2016   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    Patient Active Problem List   Diagnosis Date Noted   Leg pain 09/29/2020   Palpitations 05/26/2020   Dyspnea on exertion 05/26/2020   Atypical chest pain 05/26/2020   Prediabetes 02/13/2019   Pituitary microadenoma ([place]) 02/13/2019   Infected blister of toe of right foot 01/17/2019   Anticonvulsant-induced dizziness 12/06/2018   Anxiety, generalized 12/06/2018   Hyperglycemia 04/19/2018   Prehypertension 04/19/2018   Other chronic sinusitis 01/02/2018   Adenoid hypertrophy 12/06/2017   Deviated nasal septum 12/06/2017   Hypertrophy of inferior nasal turbinate 12/06/2017   Seasonal allergic rhinitis due to pollen 12/06/2017   Eustachian tube dysfunction, bilateral 08/10/2016   Moderate episode of recurrent major depressive disorder ([place]) 07/13/2016   Tinea versicolor 07/13/2016   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 Authorizing Provider  Fluocinolone Acetonide 0.01 % OIL Place 5 drops in ear(s) in the morning and at bedtime for 7 days. 08/10/22 08/17/22 Yes Mylin Hirano L, PA  ipratropium (ATROVENT) 0.03 %  nasal spray Place 2 sprays into both nostrils every 12 (twelve) hours. 08/10/22  Yes Lynda Wanninger L, PA  levocetirizine (XYZAL) 5 MG tablet Take 1 tablet (5 mg total) by mouth every evening. 08/10/22  Yes Taeshawn Helfman L, PA  predniSONE (DELTASONE) 50 MG tablet Take 1 tablet (50 mg total) by mouth daily with breakfast. 08/10/22  Yes Jamea Robicheaux L, PA  triamcinolone ointment (KENALOG) 0.5 % Apply 1 Application topically 2 (two) times daily. Do not exceed 14 consecutive days of use. 08/10/22  Yes Tyrena Gohr L, PA  baclofen (LIORESAL) 10 MG tablet Take 0.5-1 tablets (5-10 mg total) by mouth 3 (three) times daily as needed for muscle spasms. May cause drowsiness. 05/16/21   Arruda, Amy L, PA  COVID-19 mRNA bivalent vaccine, Pfizer, injection Inject into the muscle. 02/23/21   Judyann Munson, MD  diclofenac (VOLTAREN) 50 MG EC tablet Take 1 tablet (50 mg total) by mouth 2 (two) times daily. 05/16/21   Vallery Sa, Amy L, PA  Erythromycin 2 % PADS Use pad to clean face and scalp twice a day to prevent breakouts 01/24/21   Eustace Moore, MD  escitalopram (LEXAPRO) 10 MG tablet Take 10 mg by mouth daily.    [provider]  lamoTRIgine (LAMICTAL) 25 MG tablet Take 50 mg by mouth daily. 02/08/20   [provider]    Family History Family History  Problem Relation Age of Onset   Healthy Mother    Diabetes Father    Fibromyalgia Sister     Social History Social History   Tobacco Use   Smoking status: Every Day    Packs/day: 0.12    Years: 1.00    Additional pack years: 0.00    Total pack years: 0.12    Types: Cigarettes   Smokeless tobacco: Never  Vaping Use   Vaping Use: Never used  Substance Use Topics   Alcohol use: Not Currently    Alcohol/week: 70.0 standard drinks of alcohol    Types: 70 Standard drinks or equivalent per week    Comment: white claws (6-12 night) x 2.5 months   Drug use: Never     Allergies   Clindamycin/lincomycin and Lorazepam   Review  of Systems Review of Systems  HENT:  Positive for ear pain.    As per HPI  Physical Exam Triage Vital Signs ED Triage Vitals  Enc Vitals Group     BP 08/10/22 1611 139/83     Pulse Rate 08/10/22 1611 86     Resp 08/10/22 1611 17     Temp 08/10/22 1611 98.4 F (36.9 C)     Temp Source 08/10/22 1611 Oral     SpO2 08/10/22 1611 99 %     Weight --      Height --      Head Circumference --      Peak Flow --      Pain Score 08/10/22 1613 1     Pain Loc --      Pain Edu? --      Excl. in GC? --  No data found.  Updated Vital Signs BP 139/83 (BP Location: Right Arm)   Pulse 86   Temp 98.4 F (36.9 C) (Oral)   Resp 17   SpO2 99%   Visual Acuity Right Eye Distance:   Left Eye Distance:   Bilateral Distance:    Right Eye Near:   Left Eye Near:    Bilateral Near:     Physical Exam Vitals and nursing note reviewed.  Constitutional:      General: He is not in acute distress.    Appearance: Normal appearance. He is not ill-appearing, toxic-appearing or diaphoretic.  HENT:     Head: Normocephalic and atraumatic.     Jaw: No swelling.     Salivary Glands: Right salivary gland is not diffusely enlarged or tender. Left salivary gland is not diffusely enlarged or tender.     Right Ear: Ear canal and external ear normal. No drainage, swelling or tenderness. A middle ear effusion is present. There is no impacted cerumen. No mastoid tenderness. No hemotympanum. Tympanic membrane is not injected, perforated, erythematous, retracted or bulging.     Left Ear: Ear canal and external ear normal. No drainage, swelling or tenderness. A middle ear effusion is present. There is impacted cerumen. No mastoid tenderness. No hemotympanum. Tympanic membrane is not injected, perforated, erythematous, retracted or bulging.     Ears:     Comments: L cerumen Removed in office B eczema of external auditory canal Minimal amount of clear effusion noted bilaterally, R>L    Nose: Congestion present.  No rhinorrhea.     Right Sinus: No maxillary sinus tenderness or frontal sinus tenderness.     Left Sinus: No maxillary sinus tenderness or frontal sinus tenderness.     Mouth/Throat:     Mouth: Mucous membranes are moist. No oral lesions.     Tongue: No lesions.     Pharynx: Oropharynx is clear. Uvula midline. No pharyngeal swelling, oropharyngeal exudate, posterior oropharyngeal erythema or uvula swelling.     Tonsils: No tonsillar exudate or tonsillar abscesses.  Eyes:     Extraocular Movements: Extraocular movements intact.     Conjunctiva/sclera: Conjunctivae normal.     Pupils: Pupils are equal, round, and reactive to light.  Cardiovascular:     Rate and Rhythm: Normal rate and regular rhythm.  Pulmonary:     Effort: Pulmonary effort is normal. No respiratory distress.     Breath sounds: Normal breath sounds. No stridor. No wheezing, rhonchi or rales.  Musculoskeletal:     Cervical back: Normal range of motion. No rigidity or tenderness.  Lymphadenopathy:     Cervical: No cervical adenopathy.  Skin:    General: Skin is warm.     Capillary Refill: Capillary refill takes less than 2 seconds.     Findings: No erythema or rash (nummular eczema noted to abdomen).  Neurological:     General: No focal deficit present.     Mental Status: He is alert and oriented to person, place, and time.      UC Treatments / Results  Labs (all labs ordered are listed, but only abnormal results are displayed) Labs Reviewed - No data to display  EKG   Radiology No results found.  Procedures Ear Cerumen Removal  Date/Time: 08/10/2022 4:38 PM  Performed by: Chaney Malling, PA Authorized by: Chaney Malling, PA   Consent:    Consent obtained:  Verbal   Consent given by:  Patient   Risks discussed:  Bleeding, dizziness, infection,  pain, incomplete removal and TM perforation Universal protocol:    Procedure explained and questions answered to patient or proxy's satisfaction: yes      Patient identity confirmed:  Verbally with patient Procedure details:    Location:  L ear   Procedure type: curette     Procedure outcomes: cerumen removed   Post-procedure details:    Inspection:  No bleeding, ear canal clear and TM intact   Hearing quality:  Improved   Procedure completion:  Tolerated  (including critical care time)  Medications Ordered in UC Medications - No data to display  Initial Impression / Assessment and Plan / UC Course  I have reviewed the triage vital signs and the nursing notes.  Pertinent labs & imaging results that were available during my care of the patient were reviewed by me and considered in my medical decision making (see chart for details).     Nummular dermatitis -per patient, biopsy confirmed.  Will recommend patient use Aquaphor as his primary method of treatment, but may also use topical triamcinolone for up to 14 days.  Do not occlude the rash. Eustachian tube dysfunction -there is no evidence of a bacterial infection in his ears.  Will do a trial with nasal spray and antihistamines first.  If symptoms do not improve with this, patient to take p.o. steroids as well. Ear canal dryness -suspect patient has eczema of his external ear canals as well.  Patient to use as needed fluocinolone otic oil and prevent using Q-tips. Cerumen impaction left ear -removed with curette in clinic.   Final Clinical Impressions(s) / UC Diagnoses   Final diagnoses:  Nummular dermatitis  Eustachian tube disorder, bilateral  Ear canal dryness, bilateral  Impacted cerumen of left ear     Discharge Instructions      Your dermatitis appears to be a nummular dermatitis. Please apply topical Aquaphor to the affected regions. I did call in a topical steroid cream for you to use.  Please do not exceed 14 consecutive days of use.  You do not have an active ear infection.  I suspect this is secondary to eustachian tube disorder. Please start with taking the  antihistamine and the nasal spray.  If you do not have significant improvement over the next several days, you may also start taking an oral steroid. Do not use the topical steroid while on the oral steroid.  Lastly, to help with your ear itchiness, I have called in fluocinolone oil.  You will use this on an as-needed basis, again not to exceed 14 consecutive days of use.     ED Prescriptions     Medication Sig Dispense Auth. Provider   triamcinolone ointment (KENALOG) 0.5 % Apply 1 Application topically 2 (two) times daily. Do not exceed 14 consecutive days of use. 30 g Shreya Lacasse L, PA   ipratropium (ATROVENT) 0.03 % nasal spray Place 2 sprays into both nostrils every 12 (twelve) hours. 30 mL Aveleen Nevers L, PA   predniSONE (DELTASONE) 50 MG tablet Take 1 tablet (50 mg total) by mouth daily with breakfast. 5 tablet Bain Whichard L, PA   Fluocinolone Acetonide 0.01 % OIL Place 5 drops in ear(s) in the morning and at bedtime for 7 days. 20 mL Paris Hohn L, PA   levocetirizine (XYZAL) 5 MG tablet Take 1 tablet (5 mg total) by mouth every evening. 30 tablet Caran Storck L, [place]      PDMP not reviewed this encounter.   Chaney Malling, [place]  08/10/22 1950  

## 2022-08-11 ENCOUNTER — Telehealth: Payer: Self-pay

## 2022-08-11 NOTE — Telephone Encounter (Signed)
Pt called stating he would like a lower dose of prednisone called in to Pharmacy. Per provider on staff, dose is appropriate based off weight and reason for treatment. If he would like to cut the dose in half, he can however small dose might not be as effective. Pt verbalized understanding.

## 2022-09-01 ENCOUNTER — Ambulatory Visit: Payer: 59

## 2022-09-01 ENCOUNTER — Ambulatory Visit
Admission: EM | Admit: 2022-09-01 | Discharge: 2022-09-01 | Disposition: A | Discharge status: Home / Self Care | Payer: 59 | Attending: Family Medicine | Admitting: Family Medicine

## 2022-09-01 DIAGNOSIS — R110 Nausea: Secondary | ICD-10-CM

## 2022-09-01 DIAGNOSIS — R5383 Other fatigue: Secondary | ICD-10-CM

## 2022-09-01 LAB — POC SARS CORONAVIRUS 2 AG -  ED: SARS Coronavirus 2 Ag: NEGATIVE

## 2022-09-01 LAB — POCT INFLUENZA A/B
Influenza A, POC: NEGATIVE
Influenza B, POC: NEGATIVE

## 2022-09-01 NOTE — Discharge Instructions (Addendum)
Advised patient point-of-care influenza and COVID-19 were negative advised patient if symptoms worsen and/or unresolved please follow-up with PCP for further evaluation.

## 2022-09-01 NOTE — ED Triage Notes (Signed)
Pt presents with fatigue, intermittent nausea, abdominal pain, and back pain that began 3 days ago.

## 2022-09-01 NOTE — ED Provider Notes (Addendum)
[facility]    CSN: [id number] Arrival date & time: 09/01/22  1610      History   Chief Complaint Chief Complaint  Patient presents with   Fatigue   Abdominal Pain    HPI Kyle Barnett is a 42 y.o. male.   HPI ale presents with fatigue, intermittent nausea, cramping abdominal pain, and back pain that began 3 days ago.  PMH significant for obesity, pituitary adenoma, atypical chest pain, and hyperglycemia.  Patient reports mild nausea in clinic at beginning his exam and was offered Zofran but has deferred reports he does not want to take any medicine at this time.  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   Deviated nasal septum 12/06/2017   Dyslipidemia 05/12/2013   Dyspnea on exertion 05/26/2020   Eustachian tube dysfunction, bilateral 08/10/2016   Fullness in ear, bilateral 04/30/2020   Hyperglycemia 04/19/2018   Hyperlipidemia 05/12/2013   Hypertrophy of inferior nasal turbinate 12/06/2017   Infected blister of toe of right foot 01/17/2019   Moderate episode of recurrent major depressive disorder 07/13/2016   Otalgia of both ears 04/30/2020   Other chronic sinusitis 01/02/2018   Palpitations 05/26/2020   Perennial allergic rhinitis 07/26/2013   Pituitary microadenoma 02/13/2019   Prediabetes 02/13/2019   Prehypertension 04/19/2018   Seasonal allergic rhinitis due to pollen 12/06/2017   Tinea versicolor 07/13/2016   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    Patient Active Problem List   Diagnosis Date Noted   Leg pain 09/29/2020   Palpitations 05/26/2020   Dyspnea on exertion 05/26/2020   Atypical chest pain 05/26/2020   Prediabetes 02/13/2019   Pituitary microadenoma 02/13/2019   Infected blister of toe of right foot 01/17/2019   Anticonvulsant-induced dizziness  12/06/2018   Anxiety, generalized 12/06/2018   Hyperglycemia 04/19/2018   Prehypertension 04/19/2018   Other chronic sinusitis 01/02/2018   Adenoid hypertrophy 12/06/2017   Deviated nasal septum 12/06/2017   Hypertrophy of inferior nasal turbinate 12/06/2017   Seasonal allergic rhinitis due to pollen 12/06/2017   Eustachian tube dysfunction, bilateral 08/10/2016   Moderate episode of recurrent major depressive disorder 07/13/2016   Tinea versicolor 07/13/2016   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 Authorizing Provider  baclofen (LIORESAL) 10 MG tablet Take 0.5-1 tablets (5-10 mg total) by mouth 3 (three) times daily as needed for muscle spasms. May cause drowsiness. 05/16/21   Arruda, Amy L, PA  COVID-19 mRNA bivalent vaccine, Pfizer, injection Inject into the muscle. 02/23/21   Judyann Munson, MD  diclofenac (VOLTAREN) 50 MG EC tablet Take 1 tablet (50 mg total) by mouth 2 (two) times daily. 05/16/21   Vallery Sa, Amy L, PA  Erythromycin 2 % PADS Use pad to clean face and scalp twice a day to prevent breakouts 01/24/21   Eustace Moore, MD  escitalopram (LEXAPRO) 10 MG tablet Take 10 mg by mouth daily.    [provider]  ipratropium (ATROVENT) 0.03 % nasal spray Place 2 sprays into both nostrils every 12 (twelve) hours. 08/10/22  Crain, Whitney L, PA  lamoTRIgine (LAMICTAL) 25 MG tablet Take 50 mg by mouth daily. 02/08/20   [provider]  levocetirizine (XYZAL) 5 MG tablet Take 1 tablet (5 mg total) by mouth every evening. 08/10/22   Crain, Whitney L, PA  predniSONE (DELTASONE) 50 MG tablet Take 1 tablet (50 mg total) by mouth daily with breakfast. 08/10/22   Crain, Whitney L, PA  triamcinolone ointment  (KENALOG) 0.5 % Apply 1 Application topically 2 (two) times daily. Do not exceed 14 consecutive days of use. 08/10/22   Maretta Beesrain, Whitney L, PA    Family History Family History  Problem Relation Age of Onset   Healthy Mother    Diabetes Father    Fibromyalgia Sister     Social History Social History   Tobacco Use   Smoking status: Every Day    Packs/day: 0.12    Years: 1.00    Additional pack years: 0.00    Total pack years: 0.12    Types: Cigarettes   Smokeless tobacco: Never  Vaping Use   Vaping Use: Never used  Substance Use Topics   Alcohol use: Not Currently    Alcohol/week: 70.0 standard drinks of alcohol    Types: 70 Standard drinks or equivalent per week    Comment: white claws (6-12 night) x 2.5 months   Drug use: Never     Allergies   Clindamycin/lincomycin and Lorazepam   Review of Systems Review of Systems  Constitutional:  Positive for fatigue.  Gastrointestinal:  Positive for abdominal pain and nausea.  Musculoskeletal:  Positive for back pain.  All other systems reviewed and are negative.    Physical Exam Triage Vital Signs ED Triage Vitals [09/01/22 1615]  Enc Vitals Group     BP (!) 145/81     Pulse Rate 92     Resp 12     Temp 98.5 F (36.9 C)     Temp Source Oral     SpO2 100 %     Weight      Height      Head Circumference      Peak Flow      Pain Score 0     Pain Loc      Pain Edu?      Excl. in GC?    No data found.  Updated Vital Signs BP (!) 145/81 (BP Location: Right Arm)   Pulse 92   Temp 98.5 F (36.9 C) (Oral)   Resp 12   SpO2 100%    Physical Exam Vitals and nursing note reviewed.  Constitutional:      Appearance: He is well-developed. He is obese.  HENT:     Head: Normocephalic and atraumatic.     Right Ear: Tympanic membrane, ear canal and external ear normal.     Left Ear: Tympanic membrane, ear canal and external ear normal.     Mouth/Throat:     Mouth: Mucous membranes are moist.     Pharynx:  Oropharynx is clear.  Eyes:     Extraocular Movements: Extraocular movements intact.     Pupils: Pupils are equal, round, and reactive to light.  Cardiovascular:     Rate and Rhythm: Normal rate and regular rhythm.     Pulses: Normal pulses.     Heart sounds: Normal heart sounds.  Pulmonary:     Effort: Pulmonary effort is normal.     Breath sounds: Normal breath sounds.  Abdominal:     General: There is  no distension.     Palpations: There is no mass.     Tenderness: There is no abdominal tenderness. There is no right CVA tenderness, left CVA tenderness, guarding or rebound.     Hernia: No hernia is present.  Musculoskeletal:        General: Normal range of motion.     Cervical back: Normal range of motion and neck supple.  Skin:    General: Skin is warm and dry.  Neurological:     General: No focal deficit present.     Mental Status: He is alert and oriented to person, place, and time. Mental status is at baseline.      UC Treatments / Results  Labs (all labs ordered are listed, but only abnormal results are displayed) Labs Reviewed  POCT INFLUENZA A/B  POC SARS CORONAVIRUS 2 AG -  ED    EKG   Radiology No results found.  Procedures Procedures (including critical care time)  Medications Ordered in UC Medications - No data to display  Initial Impression / Assessment and Plan / UC Course  I have reviewed the triage vital signs and the nursing notes.  Pertinent labs & imaging results that were available during my care of the patient were reviewed by me and considered in my medical decision making (see chart for details).     MDM: 1.  Fatigue-POCT influenza negative, POCT COVID 19 negative; 2. Nausea-deferred Zofran in clinic reports does not want to take medication at this time. Advised patient point-of-care influenza and COVID-19 were negative advised patient if symptoms worsen and/or unresolved please follow-up with PCP for further evaluation.  Discharged home,  hemodynamically stable. Final Clinical Impressions(s) / UC Diagnoses   Final diagnoses:  Fatigue, unspecified type  Nausea     Discharge Instructions      Advised patient point-of-care influenza and COVID-19 were negative advised patient if symptoms worsen and/or unresolved please follow-up with PCP for further evaluation.     ED Prescriptions   None    PDMP not reviewed this encounter.   Trevor Ihaagan, Edon Hoadley, FNP 09/01/22 1702    Trevor Ihaagan, Jenavieve Freda, FNP 09/01/22 1946

## 2022-10-14 ENCOUNTER — Other Ambulatory Visit: Payer: Self-pay

## 2022-10-14 ENCOUNTER — Ambulatory Visit: Payer: 59

## 2022-10-14 ENCOUNTER — Ambulatory Visit
Admission: RE | Admit: 2022-10-14 | Discharge: 2022-10-14 | Disposition: A | Discharge status: Home / Self Care | Payer: 59 | Source: Ambulatory Visit | Attending: Family Medicine | Admitting: Family Medicine

## 2022-10-14 VITALS — BP 142/92 | HR 76 | Temp 98.2°F | Resp 16

## 2022-10-14 DIAGNOSIS — L739 Follicular disorder, unspecified: Secondary | ICD-10-CM | POA: Diagnosis not present

## 2022-10-14 MED ORDER — DOXYCYCLINE HYCLATE 100 MG PO CAPS: 100.0000 mg | ORAL_CAPSULE | Freq: Two times a day (BID) | ORAL | 0 refills | Status: AC

## 2022-10-14 NOTE — ED Triage Notes (Signed)
Pt presents to uc with co of Left hip abscess for a few days. Pt reports he squeezed it last night and felt a pop but since then it has increased in swelling.

## 2022-10-14 NOTE — Discharge Instructions (Addendum)
Advised patient to take medication as directed with food to completion.  Encouraged increase daily water intake to 64 ounces per day while taking this medication.  Advised if symptoms worsen and/or unresolved please follow-up with PCP or here for further evaluation. 

## 2022-10-14 NOTE — ED Provider Notes (Signed)
[facility]    CSN: [id number] Arrival date & time: 10/14/22  1133      History   Chief Complaint Chief Complaint  Patient presents with   Abscess    Entered by patient    HPI Kyle Barnett is a 42 y.o. male.   HPI 42 year old male presents with left hip abscess for few days.  Reports squeeze last night and felt a pop and has increased swelling since.  PMH significant for anxiety, palpitations, and pituitary microadenoma.  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/13/2016   Otalgia of both ears 04/30/2020   Other chronic sinusitis 01/02/2018   Palpitations 05/26/2020   Perennial allergic rhinitis 07/26/2013   Pituitary microadenoma (HCC) 02/13/2019   Prediabetes 02/13/2019   Prehypertension 04/19/2018   Seasonal allergic rhinitis due to pollen 12/06/2017   Tinea versicolor 07/13/2016   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    Patient Active Problem List   Diagnosis Date Noted   Leg pain 09/29/2020   Palpitations 05/26/2020   Dyspnea on exertion 05/26/2020   Atypical chest pain 05/26/2020   Prediabetes 02/13/2019   Pituitary microadenoma (HCC) 02/13/2019   Infected blister of toe of right foot 01/17/2019   Anticonvulsant-induced dizziness 12/06/2018   Anxiety, generalized 12/06/2018   Hyperglycemia 04/19/2018   Prehypertension 04/19/2018   Other chronic  sinusitis 01/02/2018   Adenoid hypertrophy 12/06/2017   Deviated nasal septum 12/06/2017   Hypertrophy of inferior nasal turbinate 12/06/2017   Seasonal allergic rhinitis due to pollen 12/06/2017   Eustachian tube dysfunction, bilateral 08/10/2016   Moderate episode of recurrent major depressive disorder (HCC) 07/13/2016   Tinea versicolor 07/13/2016   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 Authorizing Provider  doxycycline (VIBRAMYCIN) 100 MG capsule Take 1 capsule (100 mg total) by mouth 2 (two) times daily for 10 days. 10/14/22 10/24/22 Yes Trevor Iha, FNP  baclofen (LIORESAL) 10 MG tablet Take 0.5-1 tablets (5-10 mg total) by mouth 3 (three) times daily as needed for muscle spasms. May cause drowsiness. 05/16/21   Arruda, Amy L, PA  COVID-19 mRNA bivalent vaccine, Pfizer, injection Inject into the muscle. 02/23/21   Judyann Munson, MD  diclofenac (VOLTAREN) 50 MG EC tablet Take 1 tablet (50 mg total) by mouth 2 (two) times daily. 05/16/21   Vallery Sa, Amy L, PA  Erythromycin 2 % PADS Use pad to clean face and scalp twice a day to prevent breakouts 01/24/21   Eustace Moore, MD  escitalopram (LEXAPRO) 10 MG tablet Take 10 mg by mouth daily.    [provider]  ipratropium (ATROVENT) 0.03 % nasal spray Place 2 sprays into both  nostrils every 12 (twelve) hours. 08/10/22   Crain, Whitney L, PA  lamoTRIgine (LAMICTAL) 25 MG tablet Take 50 mg by mouth daily. 02/08/20   [provider]  levocetirizine (XYZAL) 5 MG tablet Take 1 tablet (5 mg total) by mouth every evening. 08/10/22   Crain, Whitney L, PA  predniSONE (DELTASONE) 50 MG tablet Take 1 tablet (50 mg total) by mouth daily with breakfast. 08/10/22   Crain, Whitney  L, PA  triamcinolone ointment (KENALOG) 0.5 % Apply 1 Application topically 2 (two) times daily. Do not exceed 14 consecutive days of use. 08/10/22   Maretta Bees, PA    Family History Family History  Problem Relation Age of Onset   Healthy Mother    Diabetes Father    Fibromyalgia Sister     Social History Social History   Tobacco Use   Smoking status: Every Day    Packs/day: 0.12    Years: 1.00    Additional pack years: 0.00    Total pack years: 0.12    Types: Cigarettes   Smokeless tobacco: Never  Vaping Use   Vaping Use: Never used  Substance Use Topics   Alcohol use: Not Currently    Alcohol/week: 70.0 standard drinks of alcohol    Types: 70 Standard drinks or equivalent per week    Comment: white claws (6-12 night) x 2.5 months   Drug use: Never     Allergies   Clindamycin/lincomycin and Lorazepam   Review of Systems Review of Systems  Skin:        Possible abscess of hip x 3 days  All other systems reviewed and are negative.    Physical Exam Triage Vital Signs ED Triage Vitals [10/14/22 1215]  Enc Vitals Group     BP (!) 162/92     Pulse Rate 76     Resp 16     Temp 98.2 F (36.8 C)     Temp src      SpO2 98 %     Weight      Height      Head Circumference      Peak Flow      Pain Score 0     Pain Loc      Pain Edu?      Excl. in GC?    No data found.  Updated Vital Signs BP (!) 142/92   Pulse 76   Temp 98.2 F (36.8 C)   Resp 16   SpO2 98%       Physical Exam Vitals and nursing note reviewed.  Constitutional:      Appearance: Normal appearance. He is normal weight.  HENT:     Head: Normocephalic and atraumatic.     Mouth/Throat:     Mouth: Mucous membranes are moist.     Pharynx: Oropharynx is clear.  Eyes:     Extraocular Movements: Extraocular movements intact.     Conjunctiva/sclera: Conjunctivae normal.     Pupils: Pupils are equal, round, and reactive to light.  Cardiovascular:     Rate and Rhythm: Normal rate  and regular rhythm.     Pulses: Normal pulses.     Heart sounds: Normal heart sounds.  Pulmonary:     Effort: Pulmonary effort is normal.     Breath sounds: Normal breath sounds. No wheezing, rhonchi or rales.  Musculoskeletal:        General: Normal range of motion.     Cervical back: Normal range of motion and  neck supple.  Skin:    General: Skin is warm and dry.     Comments: Left inguinal crease: Small (<1.0 cm) mildly erythematous, non-fluctuant non-indurated abscess/folliculitis noted  Neurological:     General: No focal deficit present.     Mental Status: He is alert and oriented to person, place, and time. Mental status is at baseline.      UC Treatments / Results  Labs (all labs ordered are listed, but only abnormal results are displayed) Labs Reviewed - No data to display  EKG   Radiology No results found.  Procedures Procedures (including critical care time)  Medications Ordered in UC Medications - No data to display  Initial Impression / Assessment and Plan / UC Course  I have reviewed the triage vital signs and the nursing notes.  Pertinent labs & imaging results that were available during my care of the patient were reviewed by me and considered in my medical decision making (see chart for details).     MDM: 1.  Folliculitis-Rx'd Doxycycline 100 mg capsule twice daily x 10 days. Advised patient to take medication as directed with food to completion.  Encouraged increase daily water intake to 64 ounces per day while taking this medication.  Advised if symptoms worsen and/or unresolved please follow-up with PCP or here for further evaluation.  Patient discharged home, hemodynamically stable. Final Clinical Impressions(s) / UC Diagnoses   Final diagnoses:  Folliculitis     Discharge Instructions      Advised patient to take medication as directed with food to completion.  Encouraged increase daily water intake to 64 ounces per day while taking this  medication.  Advised if symptoms worsen and/or unresolved please follow-up with PCP or here for further evaluation.     ED Prescriptions     Medication Sig Dispense Auth. Provider   doxycycline (VIBRAMYCIN) 100 MG capsule Take 1 capsule (100 mg total) by mouth 2 (two) times daily for 10 days. 20 capsule Trevor Iha, FNP      PDMP not reviewed this encounter.   Trevor Iha, FNP 10/14/22 1307

## 2023-02-14 ENCOUNTER — Other Ambulatory Visit (HOSPITAL_BASED_OUTPATIENT_CLINIC_OR_DEPARTMENT_OTHER): Payer: Self-pay

## 2023-02-14 ENCOUNTER — Other Ambulatory Visit: Payer: Self-pay

## 2023-02-14 MED ORDER — COMIRNATY 30 MCG/0.3ML IM SUSY
PREFILLED_SYRINGE | INTRAMUSCULAR | 0 refills | Status: AC
  Filled 2023-02-14: qty 0.3, 1d supply, fill #0

## 2023-04-10 ENCOUNTER — Other Ambulatory Visit (HOSPITAL_BASED_OUTPATIENT_CLINIC_OR_DEPARTMENT_OTHER): Payer: Self-pay
